# Patient Record
Sex: Male | Born: 1945 | Hispanic: Yes | Marital: Married | Smoking: Never smoker
Health system: Southern US, Community
[De-identification: ages and names within clinical notes are randomized; demographics above are authoritative.]

## PROBLEM LIST (undated history)

## (undated) DIAGNOSIS — K219 Gastro-esophageal reflux disease without esophagitis: Secondary | ICD-10-CM

## (undated) DIAGNOSIS — I10 Essential (primary) hypertension: Secondary | ICD-10-CM

## (undated) DIAGNOSIS — J849 Interstitial pulmonary disease, unspecified: Secondary | ICD-10-CM

## (undated) DIAGNOSIS — R42 Dizziness and giddiness: Secondary | ICD-10-CM

## (undated) DIAGNOSIS — G459 Transient cerebral ischemic attack, unspecified: Secondary | ICD-10-CM

---

## 2004-11-17 ENCOUNTER — Ambulatory Visit: Payer: Self-pay | Admitting: Family Medicine

## 2005-02-10 ENCOUNTER — Emergency Department: Payer: Self-pay | Admitting: General Practice

## 2013-10-18 ENCOUNTER — Emergency Department: Payer: Self-pay | Admitting: Emergency Medicine

## 2013-10-18 LAB — CBC WITH DIFFERENTIAL/PLATELET
BASOS ABS: 0.1 10*3/uL (ref 0.0–0.1)
BASOS PCT: 0.7 %
EOS ABS: 0.2 10*3/uL (ref 0.0–0.7)
EOS PCT: 2.1 %
HCT: 47.5 % (ref 40.0–52.0)
HGB: 15.4 g/dL (ref 13.0–18.0)
Lymphocyte #: 3.4 10*3/uL (ref 1.0–3.6)
Lymphocyte %: 33.9 %
MCH: 30.8 pg (ref 26.0–34.0)
MCHC: 32.4 g/dL (ref 32.0–36.0)
MCV: 95 fL (ref 80–100)
MONO ABS: 1 x10 3/mm (ref 0.2–1.0)
Monocyte %: 9.5 %
NEUTROS PCT: 53.8 %
Neutrophil #: 5.4 10*3/uL (ref 1.4–6.5)
Platelet: 236 10*3/uL (ref 150–440)
RBC: 5 10*6/uL (ref 4.40–5.90)
RDW: 13.6 % (ref 11.5–14.5)
WBC: 10 10*3/uL (ref 3.8–10.6)

## 2013-10-18 LAB — COMPREHENSIVE METABOLIC PANEL
Albumin: 3.8 g/dL (ref 3.4–5.0)
Alkaline Phosphatase: 92 U/L
Anion Gap: 4 — ABNORMAL LOW (ref 7–16)
BUN: 11 mg/dL (ref 7–18)
Bilirubin,Total: 0.4 mg/dL (ref 0.2–1.0)
CALCIUM: 9.8 mg/dL (ref 8.5–10.1)
Chloride: 102 mmol/L (ref 98–107)
Co2: 30 mmol/L (ref 21–32)
Creatinine: 0.87 mg/dL (ref 0.60–1.30)
EGFR (Non-African Amer.): >60
Glucose: 146 mg/dL — ABNORMAL HIGH (ref 65–99)
Osmolality: 274 (ref 275–301)
Potassium: 4 mmol/L (ref 3.5–5.1)
SGOT(AST): 17 U/L (ref 15–37)
SGPT (ALT): 33 U/L (ref 12–78)
Sodium: 136 mmol/L (ref 136–145)
Total Protein: 8 g/dL (ref 6.4–8.2)

## 2013-10-18 LAB — URINALYSIS, COMPLETE
BACTERIA: NONE SEEN
BILIRUBIN, UR: NEGATIVE
BLOOD: NEGATIVE
Glucose,UR: NEGATIVE mg/dL (ref 0–75)
KETONE: NEGATIVE
Leukocyte Esterase: NEGATIVE
Nitrite: NEGATIVE
Ph: 7 (ref 4.5–8.0)
Protein: NEGATIVE
RBC,UR: NONE SEEN /HPF (ref 0–5)
SQUAMOUS EPITHELIAL: NONE SEEN
Specific Gravity: 1.005 (ref 1.003–1.030)
WBC UR: NONE SEEN /HPF (ref 0–5)

## 2013-10-18 LAB — TROPONIN I: Troponin-I: <0.02 ng/mL

## 2013-10-18 LAB — LIPASE, BLOOD: Lipase: 179 U/L (ref 73–393)

## 2013-11-16 ENCOUNTER — Emergency Department: Payer: Self-pay | Admitting: Emergency Medicine

## 2013-11-16 LAB — COMPREHENSIVE METABOLIC PANEL
ALK PHOS: 98 U/L
Albumin: 3.6 g/dL (ref 3.4–5.0)
Anion Gap: 7 (ref 7–16)
BILIRUBIN TOTAL: 0.3 mg/dL (ref 0.2–1.0)
BUN: 9 mg/dL (ref 7–18)
CHLORIDE: 107 mmol/L (ref 98–107)
CO2: 25 mmol/L (ref 21–32)
Calcium, Total: 8.8 mg/dL (ref 8.5–10.1)
Creatinine: 0.83 mg/dL (ref 0.60–1.30)
GLUCOSE: 110 mg/dL — AB (ref 65–99)
OSMOLALITY: 277 (ref 275–301)
Potassium: 3.6 mmol/L (ref 3.5–5.1)
SGOT(AST): 26 U/L (ref 15–37)
SGPT (ALT): 38 U/L (ref 12–78)
Sodium: 139 mmol/L (ref 136–145)
Total Protein: 7.8 g/dL (ref 6.4–8.2)

## 2013-11-16 LAB — CBC WITH DIFFERENTIAL/PLATELET
BASOS ABS: 0.1 10*3/uL (ref 0.0–0.1)
BASOS PCT: 1 %
Eosinophil #: 0.2 10*3/uL (ref 0.0–0.7)
Eosinophil %: 2.2 %
HCT: 45.7 % (ref 40.0–52.0)
HGB: 14.9 g/dL (ref 13.0–18.0)
Lymphocyte #: 2 10*3/uL (ref 1.0–3.6)
Lymphocyte %: 25.6 %
MCH: 30.7 pg (ref 26.0–34.0)
MCHC: 32.6 g/dL (ref 32.0–36.0)
MCV: 94 fL (ref 80–100)
Monocyte #: 0.6 x10 3/mm (ref 0.2–1.0)
Monocyte %: 8.1 %
NEUTROS ABS: 5 10*3/uL (ref 1.4–6.5)
Neutrophil %: 63.1 %
PLATELETS: 218 10*3/uL (ref 150–440)
RBC: 4.84 10*6/uL (ref 4.40–5.90)
RDW: 13.6 % (ref 11.5–14.5)
WBC: 7.9 10*3/uL (ref 3.8–10.6)

## 2013-11-16 LAB — URINALYSIS, COMPLETE
BACTERIA: NONE SEEN
BILIRUBIN, UR: NEGATIVE
BLOOD: NEGATIVE
Glucose,UR: NEGATIVE mg/dL (ref 0–75)
Ketone: NEGATIVE
Leukocyte Esterase: NEGATIVE
Nitrite: NEGATIVE
Ph: 7 (ref 4.5–8.0)
Protein: NEGATIVE
RBC, UR: NONE SEEN /HPF (ref 0–5)
Specific Gravity: 1.006 (ref 1.003–1.030)

## 2013-11-16 LAB — TROPONIN I

## 2013-11-16 LAB — LIPASE, BLOOD: Lipase: 151 U/L (ref 73–393)

## 2013-11-21 ENCOUNTER — Emergency Department: Payer: Self-pay | Admitting: Emergency Medicine

## 2013-12-05 ENCOUNTER — Ambulatory Visit: Payer: Self-pay | Admitting: Gastroenterology

## 2014-07-10 ENCOUNTER — Emergency Department: Payer: Self-pay | Admitting: Emergency Medicine

## 2014-08-12 ENCOUNTER — Ambulatory Visit: Payer: Self-pay | Admitting: Otolaryngology

## 2014-08-28 ENCOUNTER — Ambulatory Visit: Payer: Self-pay | Admitting: Otolaryngology

## 2014-12-09 LAB — SURGICAL PATHOLOGY

## 2014-12-15 NOTE — Op Note (Signed)
PATIENT NAME:  Dean Maldonado, Dean Maldonado MR#:  [id number] DATE OF BIRTH:  [birth year]  DATE OF PROCEDURE:  08/28/2014  PREOPERATIVE DIAGNOSIS: Dysphonia with right vocal cord neoplasm.   POSTOPERATIVE DIAGNOSIS: Dysphonia with right vocal cord neoplasm.   PROCEDURE: Microlaryngoscopy with microsurgical excision of right true vocal cord neoplasm.   SURGEON: Ollen Grossaul S. Willeen CassBennett, MD   ANESTHESIA: General endotracheal.   INDICATIONS: A 69 year old male with progressive hoarseness with a nodular lesion involving the right anterior true vocal cord.   FINDINGS: There was a multilobulated nodular lesion of the anterior third of the right true vocal cord. There was either inflammatory involvement or potentially invasion of the vocalis muscle noted during dissection. The lesion was sent in formalin for pathology. No other abnormalities were seen in the endolarynx or piriform sinus or tongue base.   COMPLICATIONS: None.   DESCRIPTION OF PROCEDURE: After obtaining informed consent, the patient was taken to the Operating Room and placed in the supine position. After induction of general endotracheal anesthesia, the patient was turned 90 degrees and the head draped with the eyes protected. A tooth guard was used to protect the upper dentition. A Dedo laryngoscope was then used to evaluate the airway. The hypopharynx, larynx and tongue base were carefully inspected. The scope was placed into the endolarynx and the lesion visualized. The patient was placed into suspension with the Lewy arm. Photodocumentation of the lesion was obtained with a telescope Hopkins rod. Next, the binocular microscope was placed into position and the area was carefully inspected manipulating the lesion with suctioned. An Afrin moistened pledget was used to provide some vasoconstriction to reduce bleeding. The lesion was then grasped with cup forceps and carefully resected from the underlying vocalis muscle utilizing microsurgical dissection with  microsurgical scissors. The lesion was excised in its entirety, but appeared to be adherent to the underlying vocalis muscle. Minor bleeding was controlled with Afrin moistened pledgets. The Dedo scope was then removed and the patient returned to the anesthesiologist for awakening. He was awakened and taken to the recovery room in good condition postoperatively.    ____________________________ Ollen GrossPaul S. Willeen CassBennett, MD psb:at D: 08/28/2014 08:32:22 ET T: 08/28/2014 14:15:20 ET JOB#: [id number]  cc: Ollen GrossPaul S. Willeen CassBennett, MD, <Dictator> Sandi MealyPAUL S Chesnee Floren MD ELECTRONICALLY SIGNED 09/03/2014 8:40

## 2015-06-02 ENCOUNTER — Other Ambulatory Visit: Payer: Self-pay | Admitting: Primary Care

## 2015-06-02 DIAGNOSIS — R1084 Generalized abdominal pain: Secondary | ICD-10-CM

## 2015-06-04 ENCOUNTER — Ambulatory Visit
Admission: RE | Admit: 2015-06-04 | Discharge: 2015-06-04 | Disposition: A | Discharge status: Home / Self Care | Payer: Medicare Other | Source: Ambulatory Visit | Attending: Primary Care | Admitting: Primary Care

## 2015-06-04 DIAGNOSIS — R1084 Generalized abdominal pain: Secondary | ICD-10-CM | POA: Diagnosis present

## 2017-01-24 ENCOUNTER — Other Ambulatory Visit: Payer: Self-pay | Admitting: Internal Medicine

## 2017-01-24 ENCOUNTER — Ambulatory Visit
Admission: RE | Admit: 2017-01-24 | Discharge: 2017-01-24 | Disposition: A | Discharge status: Home / Self Care | Payer: Medicare Other | Source: Ambulatory Visit | Attending: Internal Medicine | Admitting: Internal Medicine

## 2017-01-24 DIAGNOSIS — I517 Cardiomegaly: Secondary | ICD-10-CM | POA: Diagnosis not present

## 2017-01-24 DIAGNOSIS — J690 Pneumonitis due to inhalation of food and vomit: Secondary | ICD-10-CM

## 2017-01-24 DIAGNOSIS — J189 Pneumonia, unspecified organism: Secondary | ICD-10-CM | POA: Insufficient documentation

## 2017-01-24 DIAGNOSIS — I700 Atherosclerosis of aorta: Secondary | ICD-10-CM | POA: Insufficient documentation

## 2018-07-25 ENCOUNTER — Ambulatory Visit
Admission: RE | Admit: 2018-07-25 | Discharge: 2018-07-25 | Disposition: A | Discharge status: Home / Self Care | Payer: Medicare Other | Source: Ambulatory Visit | Attending: Primary Care | Admitting: Primary Care

## 2018-07-25 ENCOUNTER — Other Ambulatory Visit: Payer: Self-pay | Admitting: Primary Care

## 2018-07-25 DIAGNOSIS — R0781 Pleurodynia: Secondary | ICD-10-CM | POA: Diagnosis present

## 2020-03-04 ENCOUNTER — Observation Stay
Admission: EM | Admit: 2020-03-04 | Discharge: 2020-03-05 | Disposition: A | Discharge status: Home / Self Care | Payer: Medicare Other | Attending: Internal Medicine | Admitting: Internal Medicine

## 2020-03-04 ENCOUNTER — Observation Stay: Payer: Medicare Other

## 2020-03-04 ENCOUNTER — Observation Stay
Admit: 2020-03-04 | Discharge: 2020-03-04 | Disposition: A | Discharge status: Home / Self Care | Payer: Medicare Other | Attending: Family Medicine | Admitting: Family Medicine

## 2020-03-04 ENCOUNTER — Encounter: Payer: Self-pay | Admitting: Emergency Medicine

## 2020-03-04 ENCOUNTER — Emergency Department: Payer: Medicare Other

## 2020-03-04 ENCOUNTER — Other Ambulatory Visit: Payer: Self-pay

## 2020-03-04 DIAGNOSIS — Z79899 Other long term (current) drug therapy: Secondary | ICD-10-CM | POA: Insufficient documentation

## 2020-03-04 DIAGNOSIS — R42 Dizziness and giddiness: Principal | ICD-10-CM

## 2020-03-04 DIAGNOSIS — I10 Essential (primary) hypertension: Secondary | ICD-10-CM | POA: Diagnosis not present

## 2020-03-04 DIAGNOSIS — E119 Type 2 diabetes mellitus without complications: Secondary | ICD-10-CM

## 2020-03-04 DIAGNOSIS — Z20822 Contact with and (suspected) exposure to covid-19: Secondary | ICD-10-CM | POA: Diagnosis not present

## 2020-03-04 DIAGNOSIS — H538 Other visual disturbances: Secondary | ICD-10-CM | POA: Diagnosis not present

## 2020-03-04 DIAGNOSIS — H5500 Unspecified nystagmus: Secondary | ICD-10-CM

## 2020-03-04 DIAGNOSIS — G459 Transient cerebral ischemic attack, unspecified: Secondary | ICD-10-CM

## 2020-03-04 DIAGNOSIS — H3589 Other specified retinal disorders: Secondary | ICD-10-CM | POA: Insufficient documentation

## 2020-03-04 HISTORY — DX: Essential (primary) hypertension: I10

## 2020-03-04 LAB — ETHANOL: Alcohol, Ethyl (B): <10 mg/dL (ref <10)

## 2020-03-04 LAB — URINALYSIS, COMPLETE (UACMP) WITH MICROSCOPIC
Bacteria, UA: NONE SEEN
Bilirubin Urine: NEGATIVE
Glucose, UA: >=500 mg/dL — AB
Hgb urine dipstick: NEGATIVE
Ketones, ur: 5 mg/dL — AB
Leukocytes,Ua: NEGATIVE
Nitrite: NEGATIVE
Protein, ur: NEGATIVE mg/dL
Specific Gravity, Urine: 1.032 — ABNORMAL HIGH (ref 1.005–1.030)
Squamous Epithelial / HPF: NONE SEEN (ref 0–5)
pH: 6 (ref 5.0–8.0)

## 2020-03-04 LAB — LIPID PANEL
Cholesterol: 140 mg/dL (ref 0–200)
HDL: 50 mg/dL (ref >40)
LDL Cholesterol: 71 mg/dL (ref 0–99)
Total CHOL/HDL Ratio: 2.8 RATIO
Triglycerides: 95 mg/dL (ref <150)
VLDL: 19 mg/dL (ref 0–40)

## 2020-03-04 LAB — ECHOCARDIOGRAM COMPLETE
AR max vel: 4.44 cm2
AV Area VTI: 4.49 cm2
AV Area mean vel: 4.34 cm2
AV Mean grad: 2 mmHg
AV Peak grad: 4.6 mmHg
Ao pk vel: 1.07 m/s
Area-P 1/2: 6.96 cm2
S' Lateral: 3.33 cm
Weight: 3555.58 oz

## 2020-03-04 LAB — CBC WITH DIFFERENTIAL/PLATELET
Abs Immature Granulocytes: 0.06 10*3/uL (ref 0.00–0.07)
Basophils Absolute: 0.1 10*3/uL (ref 0.0–0.1)
Basophils Relative: 1 %
Eosinophils Absolute: 0.3 10*3/uL (ref 0.0–0.5)
Eosinophils Relative: 2 %
HCT: 44.2 % (ref 39.0–52.0)
Hemoglobin: 14.6 g/dL (ref 13.0–17.0)
Immature Granulocytes: 1 %
Lymphocytes Relative: 23 %
Lymphs Abs: 2.6 10*3/uL (ref 0.7–4.0)
MCH: 31.1 pg (ref 26.0–34.0)
MCHC: 33 g/dL (ref 30.0–36.0)
MCV: 94 fL (ref 80.0–100.0)
Monocytes Absolute: 0.8 10*3/uL (ref 0.1–1.0)
Monocytes Relative: 7 %
Neutro Abs: 7.6 10*3/uL (ref 1.7–7.7)
Neutrophils Relative %: 66 %
Platelets: 213 10*3/uL (ref 150–400)
RBC: 4.7 MIL/uL (ref 4.22–5.81)
RDW: 12.5 % (ref 11.5–15.5)
WBC: 11.3 10*3/uL — ABNORMAL HIGH (ref 4.0–10.5)
nRBC: 0 % (ref 0.0–0.2)

## 2020-03-04 LAB — COMPREHENSIVE METABOLIC PANEL
ALT: 38 U/L (ref 0–44)
AST: 26 U/L (ref 15–41)
Albumin: 4 g/dL (ref 3.5–5.0)
Alkaline Phosphatase: 150 U/L — ABNORMAL HIGH (ref 38–126)
Anion gap: 8 (ref 5–15)
BUN: 16 mg/dL (ref 8–23)
CO2: 28 mmol/L (ref 22–32)
Calcium: 9.3 mg/dL (ref 8.9–10.3)
Chloride: 99 mmol/L (ref 98–111)
Creatinine, Ser: 0.85 mg/dL (ref 0.61–1.24)
GFR calc Af Amer: >60 mL/min (ref >60)
GFR calc non Af Amer: >60 mL/min (ref >60)
Glucose, Bld: 529 mg/dL (ref 70–99)
Potassium: 4.4 mmol/L (ref 3.5–5.1)
Sodium: 135 mmol/L (ref 135–145)
Total Bilirubin: 0.7 mg/dL (ref 0.3–1.2)
Total Protein: 7.7 g/dL (ref 6.5–8.1)

## 2020-03-04 LAB — URINE DRUG SCREEN, QUALITATIVE (ARMC ONLY)
Amphetamines, Ur Screen: NOT DETECTED
Barbiturates, Ur Screen: NOT DETECTED
Benzodiazepine, Ur Scrn: NOT DETECTED
Cannabinoid 50 Ng, Ur ~~LOC~~: NOT DETECTED
Cocaine Metabolite,Ur ~~LOC~~: NOT DETECTED
MDMA (Ecstasy)Ur Screen: NOT DETECTED
Methadone Scn, Ur: NOT DETECTED
Opiate, Ur Screen: NOT DETECTED
Phencyclidine (PCP) Ur S: NOT DETECTED
Tricyclic, Ur Screen: NOT DETECTED

## 2020-03-04 LAB — GLUCOSE, CAPILLARY
Glucose-Capillary: 420 mg/dL — ABNORMAL HIGH (ref 70–99)
Glucose-Capillary: 598 mg/dL (ref 70–99)
Glucose-Capillary: 366 mg/dL — ABNORMAL HIGH (ref 70–99)
Glucose-Capillary: 311 mg/dL — ABNORMAL HIGH (ref 70–99)
Glucose-Capillary: 229 mg/dL — ABNORMAL HIGH (ref 70–99)

## 2020-03-04 LAB — HEMOGLOBIN A1C
Hgb A1c MFr Bld: 11.4 % — ABNORMAL HIGH (ref 4.8–5.6)
Mean Plasma Glucose: 280 mg/dL

## 2020-03-04 LAB — SARS CORONAVIRUS 2 BY RT PCR (HOSPITAL ORDER, PERFORMED IN ~~LOC~~ HOSPITAL LAB): SARS Coronavirus 2: NEGATIVE

## 2020-03-04 LAB — APTT: aPTT: <24 seconds — ABNORMAL LOW (ref 24–36)

## 2020-03-04 LAB — PROTIME-INR
INR: 0.9 (ref 0.8–1.2)
Prothrombin Time: 12.2 seconds (ref 11.4–15.2)

## 2020-03-04 LAB — TROPONIN I (HIGH SENSITIVITY): Troponin I (High Sensitivity): 6 ng/L (ref <18)

## 2020-03-04 MED ORDER — ASPIRIN 81 MG PO CHEW
324.0000 mg | CHEWABLE_TABLET | Freq: Once | ORAL | Status: AC
  Administered 2020-03-04: 324 mg via ORAL
  Filled 2020-03-04: qty 4

## 2020-03-04 MED ORDER — LORAZEPAM 2 MG/ML IJ SOLN: 1.0000 mg | Freq: Once | INTRAMUSCULAR | Status: DC

## 2020-03-04 MED ORDER — LORAZEPAM 2 MG/ML IJ SOLN
INTRAMUSCULAR | Status: AC
  Administered 2020-03-04: 1 mg via INTRAVENOUS
  Filled 2020-03-04: qty 1

## 2020-03-04 MED ORDER — ONDANSETRON HCL 4 MG PO TABS: 4.0000 mg | ORAL_TABLET | Freq: Four times a day (QID) | ORAL | Status: DC | PRN

## 2020-03-04 MED ORDER — ONDANSETRON HCL 4 MG/2ML IJ SOLN: 4.0000 mg | Freq: Four times a day (QID) | INTRAMUSCULAR | Status: DC | PRN

## 2020-03-04 MED ORDER — INSULIN ASPART 100 UNIT/ML ~~LOC~~ SOLN
0.0000 [IU] | Freq: Three times a day (TID) | SUBCUTANEOUS | Status: DC
  Administered 2020-03-04: 21:00:00 3 [IU] via SUBCUTANEOUS
  Administered 2020-03-04 (×2): 9 [IU] via SUBCUTANEOUS
  Administered 2020-03-04: 19:00:00 7 [IU] via SUBCUTANEOUS
  Administered 2020-03-05 (×2): 3 [IU] via SUBCUTANEOUS
  Filled 2020-03-04 (×6): qty 1

## 2020-03-04 MED ORDER — INSULIN STARTER KIT- PEN NEEDLES (ENGLISH)
1.0000 | Freq: Once | Status: AC
  Administered 2020-03-05: 1
  Filled 2020-03-04: qty 1

## 2020-03-04 MED ORDER — LORAZEPAM 2 MG/ML IJ SOLN: 1.0000 mg | Freq: Once | INTRAMUSCULAR | Status: AC

## 2020-03-04 MED ORDER — ACETAMINOPHEN 650 MG RE SUPP: 650.0000 mg | Freq: Four times a day (QID) | RECTAL | Status: DC | PRN

## 2020-03-04 MED ORDER — ONDANSETRON HCL 4 MG/2ML IJ SOLN: 4.0000 mg | Freq: Once | INTRAMUSCULAR | Status: AC

## 2020-03-04 MED ORDER — TRAZODONE HCL 50 MG PO TABS: 25.0000 mg | ORAL_TABLET | Freq: Every evening | ORAL | Status: DC | PRN

## 2020-03-04 MED ORDER — MAGNESIUM HYDROXIDE 400 MG/5ML PO SUSP
30.0000 mL | Freq: Every day | ORAL | Status: DC | PRN
  Filled 2020-03-04: qty 30

## 2020-03-04 MED ORDER — ENOXAPARIN SODIUM 40 MG/0.4ML ~~LOC~~ SOLN
40.0000 mg | SUBCUTANEOUS | Status: DC
  Administered 2020-03-04 – 2020-03-05 (×2): 40 mg via SUBCUTANEOUS
  Filled 2020-03-04 (×2): qty 0.4

## 2020-03-04 MED ORDER — IOHEXOL 350 MG/ML SOLN
75.0000 mL | Freq: Once | INTRAVENOUS | Status: AC | PRN
  Administered 2020-03-04: 75 mL via INTRAVENOUS

## 2020-03-04 MED ORDER — SODIUM CHLORIDE 0.9 % IV SOLN: INTRAVENOUS | Status: DC

## 2020-03-04 MED ORDER — LIVING WELL WITH DIABETES BOOK
Freq: Once | Status: AC
  Filled 2020-03-04 (×2): qty 1

## 2020-03-04 MED ORDER — ONDANSETRON HCL 4 MG/2ML IJ SOLN
INTRAMUSCULAR | Status: AC
  Administered 2020-03-04: 4 mg via INTRAVENOUS
  Filled 2020-03-04: qty 2

## 2020-03-04 MED ORDER — LACTATED RINGERS IV BOLUS
1000.0000 mL | Freq: Once | INTRAVENOUS | Status: AC
  Administered 2020-03-04: 1000 mL via INTRAVENOUS

## 2020-03-04 MED ORDER — ACETAMINOPHEN 325 MG PO TABS: 650.0000 mg | ORAL_TABLET | Freq: Four times a day (QID) | ORAL | Status: DC | PRN

## 2020-03-04 MED ORDER — STROKE: EARLY STAGES OF RECOVERY BOOK: Freq: Once | Status: DC

## 2020-03-04 MED ORDER — INSULIN ASPART PROT & ASPART (70-30 MIX) 100 UNIT/ML ~~LOC~~ SUSP
28.0000 [IU] | Freq: Two times a day (BID) | SUBCUTANEOUS | Status: DC
  Administered 2020-03-04 – 2020-03-05 (×3): 28 [IU] via SUBCUTANEOUS
  Filled 2020-03-04 (×3): qty 10

## 2020-03-04 NOTE — ED Provider Notes (Signed)
[facility] Emergency Department Provider Note  ____________________________________________  Time seen: Approximately 3:21 AM  I have reviewed the triage vital signs and the nursing notes.   HISTORY  Chief Complaint Dizziness   HPI Dean Maldonado is a 74 y.o. male with a history of hypertension who presents via EMS for vertigo.  Patient reports sudden onset of vertigo 30 minutes prior to arrival.  He reports that he was laying in bed when it started.  He has vomited several times.  He denies any prior history of vertigo.  Has not seen a physician in several years.  He denies any personal or family history of stroke.  No headache, no slurred speech, no facial droop, no unilateral weakness or numbness.  No fever or chills.  No chest pain or shortness of breath.  His symptoms have been constant and severe.  They are better with his eyes closed but worse with eyes open.  He is complaining of mild blurry vision.   Past Medical History:  Diagnosis Date   Hypertension   Allergies Patient has no known allergies.  No family history on file.  Social History Social History   Tobacco Use   Smoking status: Never Smoker   Smokeless tobacco: Never Used  Substance Use Topics   Alcohol use: Not on file   Drug use: Not on file    Review of Systems  Constitutional: Negative for fever. Eyes: Negative for visual changes. ENT: Negative for sore throat. Neck: No neck pain  Cardiovascular: Negative for chest pain. Respiratory: Negative for shortness of breath. Gastrointestinal: Negative for abdominal pain or diarrhea. + Nausea and vomiting Genitourinary: Negative for dysuria. Musculoskeletal: Negative for back pain. Skin: Negative for rash. Neurological: Negative for headaches, weakness or numbness. + Vertigo Psych: No SI or HI  ____________________________________________   PHYSICAL EXAM:  VITAL SIGNS: ED Triage Vitals  Enc Vitals Group     BP  03/04/20 0130 (!) 145/82     Pulse Rate 03/04/20 0130 82     Resp 03/04/20 0130 18     Temp 03/04/20 0130 (!) 97.4 F (36.3 C)     Temp Source 03/04/20 0130 Oral     SpO2 03/04/20 0130 97 %     Weight 03/04/20 0145 222 lb 3.6 oz (100.8 kg)     Height --      Head Circumference --      Peak Flow --      Pain Score 03/04/20 0145 0     Pain Loc --      Pain Edu? --      Excl. in GC? --     Constitutional: Alert and oriented, looks unwell but in no obvious distress.  HEENT:      Head: Normocephalic and atraumatic.         Eyes: Conjunctivae are normal. Sclera is non-icteric. PERRL, EOMI      Mouth/Throat: Mucous membranes are moist.       Neck: Supple with no signs of meningismus. Cardiovascular: Regular rate and rhythm. No murmurs, gallops, or rubs. Respiratory: Normal respiratory effort. Lungs are clear to auscultation bilaterally.  Gastrointestinal: Soft, non tender. Musculoskeletal:  No edema, cyanosis, or erythema of extremities. Neurologic: Normal speech and language. Face is symmetric.  Patient has rotational nystagmus at left and gaze.  Intact strength and sensation x4, no dysmetria or pronator drift.  Gait is unable to be evaluated at this time due to severe vertigo. Skin: Skin is warm, dry and  intact. No rash noted. Psychiatric: Mood and affect are normal. Speech and behavior are normal.  ____________________________________________   LABS (all labs ordered are listed, but only abnormal results are displayed)  Labs Reviewed  CBC WITH DIFFERENTIAL/PLATELET - Abnormal; Notable for the following components:      Result Value   WBC 11.3 (*)    All other components within normal limits  URINALYSIS, COMPLETE (UACMP) WITH MICROSCOPIC - Abnormal; Notable for the following components:   Color, Urine STRAW (*)    APPearance CLEAR (*)    Specific Gravity, Urine 1.032 (*)    Glucose, UA >=500 (*)    Ketones, ur 5 (*)    All other components within normal limits   COMPREHENSIVE METABOLIC PANEL - Abnormal; Notable for the following components:   Glucose, Bld 529 (*)    Alkaline Phosphatase 150 (*)    All other components within normal limits  SARS CORONAVIRUS 2 BY RT PCR (HOSPITAL ORDER, PERFORMED IN [facility])  ETHANOL  URINE DRUG SCREEN, QUALITATIVE ([facility] ONLY)  PROTIME-INR  PROTIME-INR  APTT  TROPONIN I (HIGH SENSITIVITY)   ____________________________________________  EKG  ED ECG REPORT I, Nita Sickle, the attending physician, personally viewed and interpreted this ECG.  Normal sinus rhythm, rate of 87, normal intervals, left axis deviation, no ST elevations or depressions. ____________________________________________  RADIOLOGY  I have personally reviewed the images performed during this visit and I agree with the Radiologist's read.   Interpretation by Radiologist:  CT ANGIO HEAD W OR WO CONTRAST  Result Date: 03/04/2020 CLINICAL DATA:  Vertigo EXAM: CT ANGIOGRAPHY HEAD AND NECK TECHNIQUE: Multidetector CT imaging of the head and neck was performed using the standard protocol during bolus administration of intravenous contrast. Multiplanar CT image reconstructions and MIPs were obtained to evaluate the vascular anatomy. Carotid stenosis measurements (when applicable) are obtained utilizing NASCET criteria, using the distal internal carotid diameter as the denominator. CONTRAST:  92mL OMNIPAQUE IOHEXOL 350 MG/ML SOLN COMPARISON:  None. FINDINGS: CTA NECK FINDINGS SKELETON: There is no bony spinal canal stenosis. No lytic or blastic lesion. OTHER NECK: Normal pharynx, larynx and major salivary glands. No cervical lymphadenopathy. Unremarkable thyroid gland. UPPER CHEST: Mild biapical interstitial opacity. AORTIC ARCH: There is mild calcific atherosclerosis of the aortic arch. There is no aneurysm, dissection or hemodynamically significant stenosis of the visualized portion of the aorta. Conventional 3 vessel aortic  branching pattern. The visualized proximal subclavian arteries are widely patent. RIGHT CAROTID SYSTEM: Normal without aneurysm, dissection or stenosis. LEFT CAROTID SYSTEM: Normal without aneurysm, dissection or stenosis. VERTEBRAL ARTERIES: Left dominant configuration. Both origins are clearly patent. There is no dissection, occlusion or flow-limiting stenosis to the skull base (V1-V3 segments). CTA HEAD FINDINGS POSTERIOR CIRCULATION: --Vertebral arteries: Normal V4 segments. --Inferior cerebellar arteries: Normal. --Basilar artery: Normal. --Superior cerebellar arteries: Normal. --Posterior cerebral arteries (PCA): Normal. ANTERIOR CIRCULATION: --Intracranial internal carotid arteries: Normal. --Anterior cerebral arteries (ACA): Normal. Both A1 segments are present. Patent anterior communicating artery (a-comm). --Middle cerebral arteries (MCA): Normal. VENOUS SINUSES: As permitted by contrast timing, patent. ANATOMIC VARIANTS: None Review of the MIP images confirms the above findings. IMPRESSION: 1. No emergent large vessel occlusion or high-grade stenosis of the intracranial arteries. 2. Aortic Atherosclerosis (ICD10-I70.0). Electronically Signed   By: Deatra Robinson M.D.   On: 03/04/2020 03:50   CT ANGIO NECK W OR WO CONTRAST  Result Date: 03/04/2020 CLINICAL DATA:  Vertigo EXAM: CT ANGIOGRAPHY HEAD AND NECK TECHNIQUE: Multidetector CT imaging of the head and neck  was performed using the standard protocol during bolus administration of intravenous contrast. Multiplanar CT image reconstructions and MIPs were obtained to evaluate the vascular anatomy. Carotid stenosis measurements (when applicable) are obtained utilizing NASCET criteria, using the distal internal carotid diameter as the denominator. CONTRAST:  75mL OMNIPAQUE IOHEXOL 350 MG/ML SOLN COMPARISON:  None. FINDINGS: CTA NECK FINDINGS SKELETON: There is no bony spinal canal stenosis. No lytic or blastic lesion. OTHER NECK: Normal pharynx, larynx and  major salivary glands. No cervical lymphadenopathy. Unremarkable thyroid gland. UPPER CHEST: Mild biapical interstitial opacity. AORTIC ARCH: There is mild calcific atherosclerosis of the aortic arch. There is no aneurysm, dissection or hemodynamically significant stenosis of the visualized portion of the aorta. Conventional 3 vessel aortic branching pattern. The visualized proximal subclavian arteries are widely patent. RIGHT CAROTID SYSTEM: Normal without aneurysm, dissection or stenosis. LEFT CAROTID SYSTEM: Normal without aneurysm, dissection or stenosis. VERTEBRAL ARTERIES: Left dominant configuration. Both origins are clearly patent. There is no dissection, occlusion or flow-limiting stenosis to the skull base (V1-V3 segments). CTA HEAD FINDINGS POSTERIOR CIRCULATION: --Vertebral arteries: Normal V4 segments. --Inferior cerebellar arteries: Normal. --Basilar artery: Normal. --Superior cerebellar arteries: Normal. --Posterior cerebral arteries (PCA): Normal. ANTERIOR CIRCULATION: --Intracranial internal carotid arteries: Normal. --Anterior cerebral arteries (ACA): Normal. Both A1 segments are present. Patent anterior communicating artery (a-comm). --Middle cerebral arteries (MCA): Normal. VENOUS SINUSES: As permitted by contrast timing, patent. ANATOMIC VARIANTS: None Review of the MIP images confirms the above findings. IMPRESSION: 1. No emergent large vessel occlusion or high-grade stenosis of the intracranial arteries. 2. Aortic Atherosclerosis (ICD10-I70.0). Electronically Signed   By: Deatra RobinsonKevin  Herman M.D.   On: 03/04/2020 03:50   CT HEAD CODE STROKE WO CONTRAST`  Result Date: 03/04/2020 CLINICAL DATA:  Code stroke.  Central vertigo EXAM: CT HEAD WITHOUT CONTRAST TECHNIQUE: Contiguous axial images were obtained from the base of the skull through the vertex without intravenous contrast. COMPARISON:  None. FINDINGS: Brain: There is no mass, hemorrhage or extra-axial collection. The size and configuration of  the ventricles and extra-axial CSF spaces are normal. The brain parenchyma is normal, without evidence of acute or chronic infarction. Vascular: No abnormal hyperdensity of the major intracranial arteries or dural venous sinuses. No intracranial atherosclerosis. Skull: The visualized skull base, calvarium and extracranial soft tissues are normal. Sinuses/Orbits: No fluid levels or advanced mucosal thickening of the visualized paranasal sinuses. No mastoid or middle ear effusion. The orbits are normal. ASPECTS [facility]([place] Stroke Program Early CT Score) - Ganglionic level infarction (caudate, lentiform nuclei, internal capsule, insula, M1-M3 cortex): 7 - Supraganglionic infarction (M4-M6 cortex): 3 Total score (0-10 with 10 being normal): 10 IMPRESSION: 1. Normal head CT. 2. ASPECTS is 10. These results were called by telephone at the time of interpretation on 03/04/2020 at 2:25 am to provider [facility] Keatin Ben[facility] , who verbally acknowledged these results. Electronically Signed   By: Deatra RobinsonKevin  Herman M.D.   On: 03/04/2020 02:27     ____________________________________________   PROCEDURES  Procedure(s) performed:yes .1-3 Lead EKG Interpretation Performed by: Nita SickleVeronese, [place], MD Authorized by: Nita SickleVeronese, [place], MD     Interpretation: normal     ECG rate assessment: normal     Rhythm: sinus rhythm     Ectopy: none     Critical Care performed:  None ____________________________________________   INITIAL IMPRESSION / ASSESSMENT AND PLAN / ED COURSE  74 y.o. male with a history of hypertension who presents via EMS for vertigo.  Patient arrives with sudden onset of vertigo starting 30 minutes prior to arrival.  Neurologically he has rotational nystagmus in left and gaze but no other neuro deficits.  Unable to ambulate due to severe vertigo.   Ddx Posterior fossa stroke, head bleed, subarachnoid hemorrhage although patient denies headache.  No meningeal signs, no fever.   Patient was made a code  stroke.  Head CT negative for bleed or large ischemic stroke, confirmed by radiology.  Patient was seen by neurology who recommended against TPA.  She did recommend CTA  of the head and neck to evaluate for large vessel occlusion, followed by admission to the hospitalist service for stroke evaluation.  Patient received Zofran and 1 mg of IV Ativan to help with his symptoms.  He reports that the dizziness has improved but he still having vertigo.  Labs are pending.  EKG showing no dysrhythmias or ischemia.  History gathered from patient and his wife, care discussed with both of them.  Old medical records reviewed.  _________________________ 3:55 AM on 03/04/2020 ----------------------------------------- CT angio of the head and neck with no large vessel occlusion.  Labs showing hyperglycemia with a glucose of 529 but no signs of DKA with normal anion gap.  MRI has been ordered.  Will consult hospitalist for admission for further evaluation of possible posterior fossa CVA.  Will give IV fluids for hyperglycemia.     _____________________________________________ Please note:  Patient was evaluated in Emergency Department today for the symptoms described in the history of present illness. Patient was evaluated in the context of the global COVID-19 pandemic, which necessitated consideration that the patient might be at risk for infection with the SARS-CoV-2 virus that causes COVID-19. Institutional protocols and algorithms that pertain to the evaluation of patients at risk for COVID-19 are in a state of rapid change based on information released by regulatory bodies including the CDC and federal and state organizations. These policies and algorithms were followed during the patient's care in the ED.  Some ED evaluations and interventions may be delayed as a result of limited staffing during the pandemic.   [place] Controlled Substance Database was reviewed by  me. ____________________________________________   FINAL CLINICAL IMPRESSION(S) / ED DIAGNOSES   Final diagnoses:  New onset type 2 diabetes mellitus (HCC)  Vertigo      NEW MEDICATIONS STARTED DURING THIS VISIT:  ED Discharge Orders    None       Note:  This document was prepared using Dragon voice recognition software and may include unintentional dictation errors.    Don Perking, [place], MD 03/04/20 [phone]

## 2020-03-04 NOTE — Evaluation (Signed)
Occupational Therapy Evaluation Patient Details Name: Dean Maldonado MRN: [id number] DOB: 1946-03-27 Today's Date: 03/04/2020    History of Present Illness Dean Maldonado  is a 74 y.o. male with a known history of hypertension, presented to the emergency room with acute onset of vertigo with associated vomiting that started before he came to the ER.  He has been feeling weak all over and denies any paresthesias.    Clinical Impression   [name] was seen for OT evaluation this date. Prior to hospital admission, pt was Independent in all aspects of ADL/IADL, including working full time, and denies falls history in past 12 months. Pt lives with his spouse in a 1 story mobile home with 9 STE. Currently pt reporting symptoms have resolved, ambulates in hallway c no AD and +1 for lines/lead mgmt. Pt desat during mobility to 90%, reports cramping in side, resolved c standing rest break to 95% on RA. Pt demonstrates baseline independence to perform ADL and mobility tasks and no strength, sensory, coordination, cognitive, or functional visual deficits appreciated with assessment. No skilled OT needs identified. Will sign off. Please re-consult if additional OT needs arise.     Follow Up Recommendations  No OT follow up    Equipment Recommendations  None recommended by OT    Recommendations for Other Services       Precautions / Restrictions Precautions Precautions: None Restrictions Weight Bearing Restrictions: No      Mobility Bed Mobility Overal bed mobility: Independent                Transfers Overall transfer level: Needs assistance Equipment used: None Transfers: Sit to/from Stand Sit to Stand: Supervision         General transfer comment: Supervision - assist for lines/leads mgmt - no physical assist sit<>stand at elevated stretcher height    Balance Overall balance assessment: Modified Independent                                          ADL either performed or assessed with clinical judgement   ADL Overall ADL's : Independent                                       General ADL Comments: Performs LBD/UBD, and ADL t/fs Independent c no AD      Vision Baseline Vision/History: No visual deficits Patient Visual Report: No change from baseline Additional Comments: Pt reports blurriness resolved. Difficulty following H test however pt was distracted by cramp pain and had no functional deficits appreciated this assessment     Perception     Praxis      Pertinent Vitals/Pain Pain Assessment: Faces Faces Pain Scale: Hurts little more Pain Location: L chest cramp  Pain Descriptors / Indicators: Cramping Pain Intervention(s): Limited activity within patient's tolerance;Repositioned     Hand Dominance     Extremity/Trunk Assessment Upper Extremity Assessment Upper Extremity Assessment: Overall WFL for tasks assessed (5/5 grossly )   Lower Extremity Assessment Lower Extremity Assessment: Overall WFL for tasks assessed       Communication Communication Communication: No difficulties   Cognition Arousal/Alertness: Awake/alert Behavior During Therapy: WFL for tasks assessed/performed Overall Cognitive Status: Within Functional Limits for tasks assessed  General Comments  Seated EOB: SpO2 98% on RA. Ambulating in hallway: desat 90%, pt reports cramping in side, resolved c rest break to 95% on RA    Exercises Exercises: Other exercises Other Exercises Other Exercises: Pt educated re: OT role, d/c recs, falls prevention, home/routine modifications, pain mgmt Other Exercises: LBD, UBD, sup<>sit, sit<>stand, sitting/standing balance/tolerance, functional reach, ~200 ft mobility,    Shoulder Instructions      Home Living Family/patient expects to be discharged to:: Private residence Living Arrangements: Spouse/significant other;Children (48 yo  daughter) Available Help at Discharge: Family;Available 24 hours/day Type of Home: Mobile home Home Access: Stairs to enter Entrance Stairs-Number of Steps: 9   Home Layout: One level     Bathroom Shower/Tub: Tub/shower unit         Home Equipment: None          Prior Functioning/Environment Level of Independence: Independent        Comments: Works full time, is very active        OT Problem List:        OT Treatment/Interventions:      OT Goals(Current goals can be found in the care plan section) Acute Rehab OT Goals Patient Stated Goal: To return to workl OT Goal Formulation: With patient/family Time For Goal Achievement: 03/18/20 Potential to Achieve Goals: Good  OT Frequency:     Barriers to D/C:            Co-evaluation              AM-PAC OT "6 Clicks" Daily Activity     Outcome Measure Help from another person eating meals?: None Help from another person taking care of personal grooming?: None Help from another person toileting, which includes using toliet, bedpan, or urinal?: None Help from another person bathing (including washing, rinsing, drying)?: A Little Help from another person to put on and taking off regular upper body clothing?: None Help from another person to put on and taking off regular lower body clothing?: A Little 6 Click Score: 22   End of Session Nurse Communication: Mobility status  Activity Tolerance: Patient tolerated treatment well Patient left: in bed;with call bell/phone within reach;with family/visitor present  OT Visit Diagnosis: Other abnormalities of gait and mobility (R26.89)                Time: 4235-3614 OT Time Calculation (min): 18 min Charges:  OT General Charges $OT Visit: 1 Visit OT Evaluation $OT Eval Low Complexity: 1 Low OT Treatments $Self Care/Home Management : 8-22 mins  Kathie Dike, M.S. OTR/L  03/04/20, 1:31 PM

## 2020-03-04 NOTE — ED Notes (Signed)
Pt returns to room from MRI

## 2020-03-04 NOTE — Progress Notes (Signed)
SLP Cancellation Note  Patient Details Name: Dean Maldonado MRN: [id number] DOB: 1946/05/26   Cancelled treatment:       Reason Eval/Treat Not Completed: SLP screened, no needs identified, will sign off (chart reviewed; consulted pt/NSG). Pt denied any difficulty swallowing and is currently on a regular diet; tolerates swallowing pills w/ water per NSG. Pt conversed in conversation w/out deficits noted; pt denied any speech-language deficits. Speech clear. He chose items for his dinner meal - SLP assisted in placing order to Dining services.  No further skilled ST services indicated as pt appears at his baseline. Pt agreed. NSG to reconsult if any change in status while admitted.    Jerilynn Som, MS, CCC-SLP Jurrell Royster 03/04/2020, 5:43 PM

## 2020-03-04 NOTE — ED Notes (Signed)
Pt actively vomiting; st went to bed at 11pm and awoke approx 1am with dizziness, N/V; pt denies hx of same; denies any other accomp symptoms; denies any recent illness

## 2020-03-04 NOTE — Progress Notes (Signed)
*  PRELIMINARY RESULTS* Echocardiogram 2D Echocardiogram has been performed.  Dean Maldonado 03/04/2020, 9:40 AM

## 2020-03-04 NOTE — ED Notes (Signed)
Pt to CT via stretcher accomp by CT tech and nurse

## 2020-03-04 NOTE — H&P (Addendum)
[facility]   PATIENT NAME: Dean Maldonado    MR#:  [id number]  DATE OF BIRTH:  1946-01-28  DATE OF ADMISSION:  03/04/2020  PRIMARY CARE PHYSICIAN: Sandrea Hughs, NP   REQUESTING/REFERRING PHYSICIAN: Cecil Cobbs, MD  CHIEF COMPLAINT:   Chief Complaint  Patient presents with  . Dizziness    HISTORY OF PRESENT ILLNESS:  Dean Maldonado  is a 74 y.o. male with a known history of hypertension, presented to the emergency room with acute onset of vertigo with associated vomiting that started before he came to the ER.  He has been feeling weak all over and denies any paresthesias.  He denied any headache or dizziness or blurred vision or diplopia.  No dysphagia or dysarthria tinnitus or focal muscle weakness.  He admitted to polyuria polydipsia.  No dysuria, or hematuria or flank pain.  No cough or wheezing.  No chest pain or palpitations.  Upon presentation to the emergency room, blood pressure was 145/82 with otherwise normal vital signs.  Labs revealed hyperglycemia of 529 with otherwise unremarkable CMP. COVID-19 PCR came back negative.  Urinalysis showed more than 500 glucose and specifically 81,032.  Alcohol levels less than 10.  Urine drug screen came back negative.  Noncontrasted head CT scan revealed no acute internal cranial abnormalities.  CT a of the head and neck revealed no occlusion or high-grade stenosis. EKG shows sinus rhythm with a rate of 87 with left atrial enlargement is currently.    The patient was given 4 of aspirin, 1 L of lactated Ringer, 1 mg of IV Ativan and 4 mg of IV Zofran.  He will be admitted to an observation medically monitored bed for further evaluation and management.  PAST MEDICAL HISTORY:   Past Medical History:  Diagnosis Date  . Hypertension     PAST SURGICAL HISTORY:  History reviewed. No pertinent surgical history.  No reported previous surgeries. SOCIAL HISTORY:   Social History   Tobacco Use  .  Smoking status: Never Smoker  . Smokeless tobacco: Never Used  Substance Use Topics  . Alcohol use: Not on file    FAMILY HISTORY:  No family history on file.  No pertinent familial diseases.  DRUG ALLERGIES:  No Known Allergies  REVIEW OF SYSTEMS:   ROS As per history of present illness. All pertinent systems were reviewed above. Constitutional, HEENT, cardiovascular, respiratory, GI, GU, musculoskeletal, neuro, psychiatric, endocrine, integumentary and hematologic systems were reviewed and are otherwise negative/unremarkable except for positive findings mentioned above in the HPI.   MEDICATIONS AT HOME:   Prior to Admission medications   Medication Sig Start Date End Date Taking? Authorizing Provider  aspirin 81 MG EC tablet Take by mouth.   Yes [provider]  atorvastatin (LIPITOR) 20 MG tablet Take 20 mg by mouth daily. 11/27/19  Yes [provider]  lisinopril-hydrochlorothiazide (ZESTORETIC) 20-12.5 MG tablet Take 1 tablet by mouth daily. 09/10/19  Yes [provider]  pantoprazole (PROTONIX) 40 MG tablet Take 40 mg by mouth daily. 01/01/20  Yes [provider]  tamsulosin (FLOMAX) 0.4 MG CAPS capsule Take 0.4 mg by mouth daily. 01/01/20  Yes [provider]      VITAL SIGNS:  Blood pressure 133/71, pulse 80, temperature (!) 97.4 F (36.3 C), temperature source Oral, resp. rate 20, weight 100.8 kg, SpO2 93 %.  PHYSICAL EXAMINATION:  Physical Exam  GENERAL:  74 y.o.-year-old male male patient lying in the bed with no acute  distress.  EYES: Pupils equal, round, reactive to light and accommodation. No scleral icterus. Extraocular muscles intact.  HEENT: Head atraumatic, normocephalic. Oropharynx and nasopharynx clear.  NECK:  Supple, no jugular venous distention. No thyroid enlargement, no tenderness.  LUNGS: Normal breath sounds bilaterally, no wheezing, rales,rhonchi or crepitation. No use of accessory muscles of respiration.   CARDIOVASCULAR: Regular rate and rhythm, S1, S2 normal. No murmurs, rubs, or gallops.  ABDOMEN: Soft, nondistended, nontender. Bowel sounds present. No organomegaly or mass.  EXTREMITIES: No pedal edema, cyanosis, or clubbing.  NEUROLOGIC: Cranial nerves II through XII are intact. Muscle strength 5/5 in all extremities. Sensation intact. Gait not checked.  No apparent nystagmus currently. PSYCHIATRIC: The patient is alert and oriented x 3.  Normal affect and good eye contact. SKIN: No obvious rash, lesion, or ulcer.   LABORATORY PANEL:   CBC Recent Labs  Lab 03/04/20 0155  WBC 11.3*  HGB 14.6  HCT 44.2  PLT 213   ------------------------------------------------------------------------------------------------------------------  Chemistries  Recent Labs  Lab 03/04/20 0237  NA 135  K 4.4  CL 99  CO2 28  GLUCOSE 529*  BUN 16  CREATININE 0.85  CALCIUM 9.3  AST 26  ALT 38  ALKPHOS 150*  BILITOT 0.7   ------------------------------------------------------------------------------------------------------------------  Cardiac Enzymes No results for input(s): TROPONINI in the last 168 hours. ------------------------------------------------------------------------------------------------------------------  RADIOLOGY:  CT ANGIO HEAD W OR WO CONTRAST  Result Date: 03/04/2020 CLINICAL DATA:  Vertigo EXAM: CT ANGIOGRAPHY HEAD AND NECK TECHNIQUE: Multidetector CT imaging of the head and neck was performed using the standard protocol during bolus administration of intravenous contrast. Multiplanar CT image reconstructions and MIPs were obtained to evaluate the vascular anatomy. Carotid stenosis measurements (when applicable) are obtained utilizing NASCET criteria, using the distal internal carotid diameter as the denominator. CONTRAST:  75mL OMNIPAQUE IOHEXOL 350 MG/ML SOLN COMPARISON:  None. FINDINGS: CTA NECK FINDINGS SKELETON: There is no bony spinal canal stenosis. No lytic or  blastic lesion. OTHER NECK: Normal pharynx, larynx and major salivary glands. No cervical lymphadenopathy. Unremarkable thyroid gland. UPPER CHEST: Mild biapical interstitial opacity. AORTIC ARCH: There is mild calcific atherosclerosis of the aortic arch. There is no aneurysm, dissection or hemodynamically significant stenosis of the visualized portion of the aorta. Conventional 3 vessel aortic branching pattern. The visualized proximal subclavian arteries are widely patent. RIGHT CAROTID SYSTEM: Normal without aneurysm, dissection or stenosis. LEFT CAROTID SYSTEM: Normal without aneurysm, dissection or stenosis. VERTEBRAL ARTERIES: Left dominant configuration. Both origins are clearly patent. There is no dissection, occlusion or flow-limiting stenosis to the skull base (V1-V3 segments). CTA HEAD FINDINGS POSTERIOR CIRCULATION: --Vertebral arteries: Normal V4 segments. --Inferior cerebellar arteries: Normal. --Basilar artery: Normal. --Superior cerebellar arteries: Normal. --Posterior cerebral arteries (PCA): Normal. ANTERIOR CIRCULATION: --Intracranial internal carotid arteries: Normal. --Anterior cerebral arteries (ACA): Normal. Both A1 segments are present. Patent anterior communicating artery (a-comm). --Middle cerebral arteries (MCA): Normal. VENOUS SINUSES: As permitted by contrast timing, patent. ANATOMIC VARIANTS: None Review of the MIP images confirms the above findings. IMPRESSION: 1. No emergent large vessel occlusion or high-grade stenosis of the intracranial arteries. 2. Aortic Atherosclerosis (ICD10-I70.0). Electronically Signed   By: Deatra RobinsonKevin  Herman M.D.   On: 03/04/2020 03:50   CT ANGIO NECK W OR WO CONTRAST  Result Date: 03/04/2020 CLINICAL DATA:  Vertigo EXAM: CT ANGIOGRAPHY HEAD AND NECK TECHNIQUE: Multidetector CT imaging of the head and neck was performed using the standard protocol during bolus administration of intravenous contrast. Multiplanar CT image reconstructions and MIPs were obtained  to evaluate  the vascular anatomy. Carotid stenosis measurements (when applicable) are obtained utilizing NASCET criteria, using the distal internal carotid diameter as the denominator. CONTRAST:  43mL OMNIPAQUE IOHEXOL 350 MG/ML SOLN COMPARISON:  None. FINDINGS: CTA NECK FINDINGS SKELETON: There is no bony spinal canal stenosis. No lytic or blastic lesion. OTHER NECK: Normal pharynx, larynx and major salivary glands. No cervical lymphadenopathy. Unremarkable thyroid gland. UPPER CHEST: Mild biapical interstitial opacity. AORTIC ARCH: There is mild calcific atherosclerosis of the aortic arch. There is no aneurysm, dissection or hemodynamically significant stenosis of the visualized portion of the aorta. Conventional 3 vessel aortic branching pattern. The visualized proximal subclavian arteries are widely patent. RIGHT CAROTID SYSTEM: Normal without aneurysm, dissection or stenosis. LEFT CAROTID SYSTEM: Normal without aneurysm, dissection or stenosis. VERTEBRAL ARTERIES: Left dominant configuration. Both origins are clearly patent. There is no dissection, occlusion or flow-limiting stenosis to the skull base (V1-V3 segments). CTA HEAD FINDINGS POSTERIOR CIRCULATION: --Vertebral arteries: Normal V4 segments. --Inferior cerebellar arteries: Normal. --Basilar artery: Normal. --Superior cerebellar arteries: Normal. --Posterior cerebral arteries (PCA): Normal. ANTERIOR CIRCULATION: --Intracranial internal carotid arteries: Normal. --Anterior cerebral arteries (ACA): Normal. Both A1 segments are present. Patent anterior communicating artery (a-comm). --Middle cerebral arteries (MCA): Normal. VENOUS SINUSES: As permitted by contrast timing, patent. ANATOMIC VARIANTS: None Review of the MIP images confirms the above findings. IMPRESSION: 1. No emergent large vessel occlusion or high-grade stenosis of the intracranial arteries. 2. Aortic Atherosclerosis (ICD10-I70.0). Electronically Signed   By: Deatra Robinson M.D.   On:  03/04/2020 03:50   MR BRAIN WO CONTRAST  Result Date: 03/04/2020 CLINICAL DATA:  Vertigo. EXAM: MRI HEAD WITHOUT CONTRAST TECHNIQUE: Multiplanar, multiecho pulse sequences of the brain and surrounding structures were obtained without intravenous contrast. COMPARISON:  Head CT and CTA 03/04/2020 FINDINGS: Some sequences are mildly to moderately motion degraded despite the use of faster, more motion resistant imaging protocols. Brain: There is no evidence of acute infarct, intracranial hemorrhage, mass, midline shift, or extra-axial fluid collection. T2 hyperintensities in the cerebral white matter bilaterally are nonspecific but compatible with mild chronic small vessel ischemic disease. The ventricles and sulci are within normal limits for age. Vascular: Major intracranial vascular flow voids are preserved. Skull and upper cervical spine: Unremarkable bone marrow signal. Sinuses/Orbits: Unremarkable orbits. Paranasal sinuses and mastoid air cells are clear. Other: None. IMPRESSION: 1. No acute intracranial abnormality. 2. Mild chronic small vessel ischemic disease. Electronically Signed   By: Sebastian Ache M.D.   On: 03/04/2020 05:54   CT HEAD CODE STROKE WO CONTRAST`  Result Date: 03/04/2020 CLINICAL DATA:  Code stroke.  Central vertigo EXAM: CT HEAD WITHOUT CONTRAST TECHNIQUE: Contiguous axial images were obtained from the base of the skull through the vertex without intravenous contrast. COMPARISON:  None. FINDINGS: Brain: There is no mass, hemorrhage or extra-axial collection. The size and configuration of the ventricles and extra-axial CSF spaces are normal. The brain parenchyma is normal, without evidence of acute or chronic infarction. Vascular: No abnormal hyperdensity of the major intracranial arteries or dural venous sinuses. No intracranial atherosclerosis. Skull: The visualized skull base, calvarium and extracranial soft tissues are normal. Sinuses/Orbits: No fluid levels or advanced mucosal  thickening of the visualized paranasal sinuses. No mastoid or middle ear effusion. The orbits are normal. ASPECTS [facility] Stroke Program Early CT Score) - Ganglionic level infarction (caudate, lentiform nuclei, internal capsule, insula, M1-M3 cortex): 7 - Supraganglionic infarction (M4-M6 cortex): 3 Total score (0-10 with 10 being normal): 10 IMPRESSION: 1. Normal head CT. 2. ASPECTS is 10.  These results were called by telephone at the time of interpretation on 03/04/2020 at 2:25 am to provider [facility] , who verbally acknowledged these results. Electronically Signed   By: Deatra Robinson M.D.   On: 03/04/2020 02:27      IMPRESSION AND PLAN:   1.  TIA with associated nystagmus and vertigo. -The patient will be admitted to a an observation medical monitored bed. -Neurochecks will be obtained every 4 hours for 24 hours. -Patient be placed on aspirin. -We will check fasting lipids and place the patient on statin therapy.  2.  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 -The patient will be placed on frequent fingerstick blood glucose measures with resistant subcutaneous NovoLog coverage. -Will check his hemoglobin A1c. -He will receive diabetic education here.  3.  Dyslipidemia. -We will continue statin therapy and check fasting lipids.  4.  GERD. -We will continue PPI therapy and H2 blocker therapy.  5.  BPH. -We will continue Flomax.  6.  DVT prophylaxis. -Subcutaneous Lovenox.  All the records are reviewed and case discussed with ED provider. The plan of care was discussed in details with the patient (and family). I answered all questions. The patient agreed to proceed with the above mentioned plan. Further management will depend upon hospital course.   CODE STATUS: Full code  Status is: Observation  The patient remains OBS appropriate and will d/c before 2 midnights.  Dispo: The patient is from: Home              Anticipated d/c is to:  Home              Anticipated d/c date is: 1 day              Patient currently is not medically stable to d/c.   TOTAL TIME TAKING CARE OF THIS PATIENT: 50 minutes.    Hannah Beat M.D on 03/04/2020 at 6:34 AM  [facility]   From 7 PM-7 AM, contact night-coverage [email or web address]  CC: Primary care physician; Sandrea Hughs, NP   Note: This dictation was prepared with Dragon dictation along with smaller phrase technology. Any transcriptional typo errors that result from this process are unintentional.

## 2020-03-04 NOTE — Progress Notes (Signed)
PROGRESS NOTE    Dean Maldonado  DVV:[id number] DOB: [birth year] DOA: 03/04/2020 PCP: Freddy Finner, NP   Chief complaint.  Dizziness.   Brief Narrative:  Dean Maldonado  is a 75 y.o. male with a known history of hypertension, presented to the emergency room with acute onset of vertigo with associated vomiting that started before he came to the ER.  Patient is seen by teleneurology, MRI of the brain did not show any acute changes.  CT angiogram of the neck and the head did not show significant occlusion.  Patient was a found to have a glucose of 529.  He never had a diagnosed with diabetes in the past.  Insulin was started   Assessment & Plan:   Active Problems:   Dizziness  #1.  Vertigo. Probably from extreme hyperglycemia.  Neurology has seen the patient, patient does not seem to have a stroke.  He received IV fluids and insulin, vertigo symptom is improved.  2.  Uncontrolled type 2 diabetes with hyperglycemia and hyperosmolality. Patient does not have altered mental status.  He received IV fluids, he was also receiving sliding scale insulin, I added Humalog 70/30 at 28 units twice a day, will continue adjust dose.  We will also look at hemoglobin A1c ordered for tomorrow.  3.  Essential hypertension. Continue current treatment.   DVT prophylaxis: Lovenox Code Status: Full Family Communication: None Disposition Plan:  . Patient came from: home            . Anticipated d/c place: Home . Barriers to d/c OR conditions which need to be met to effect a safe d/c:   Consultants:   Neurology  Procedures:None Antimicrobials:None  Subjective: Patient dizziness getting better today.  Denies any short of breath, no cough.  No abdominal pain or nausea vomiting.  No fever chills.  Objective: Vitals:   03/04/20 0600 03/04/20 0654 03/04/20 1100 03/04/20 1200  BP: 133/71   (!) 147/80  Pulse:    97  Resp: 20   (!) 21  Temp:  98.7 F (37.1 C)    TempSrc:  Oral    SpO2:    95% 93%  Weight:       No intake or output data in the 24 hours ending 03/04/20 1520 Filed Weights   03/04/20 0145  Weight: 100.8 kg    Examination:  General exam: Appears calm and comfortable  Respiratory system: Clear to auscultation. Respiratory effort normal. Cardiovascular system: S1 & S2 heard, RRR. No JVD, murmurs, rubs, gallops or clicks. No pedal edema. Gastrointestinal system: Abdomen is nondistended, soft and nontender. No organomegaly or masses felt. Normal bowel sounds heard. Central nervous system: Alert and oriented. No focal neurological deficits. Extremities: Symmetric 5 x 5 power. Skin: No rashes, lesions or ulcers Psychiatry: Judgement and insight appear normal. Mood & affect appropriate.     Data Reviewed: I have personally reviewed following labs and imaging studies  CBC: Recent Labs  Lab 03/04/20 0155  WBC 11.3*  NEUTROABS 7.6  HGB 14.6  HCT 44.2  MCV 94.0  PLT 626   Basic Metabolic Panel: Recent Labs  Lab 03/04/20 0237  NA 135  K 4.4  CL 99  CO2 28  GLUCOSE 529*  BUN 16  CREATININE 0.85  CALCIUM 9.3   GFR: CrCl cannot be calculated (Unknown ideal weight.). Liver Function Tests: Recent Labs  Lab 03/04/20 0237  AST 26  ALT 38  ALKPHOS 150*  BILITOT 0.7  PROT 7.7  ALBUMIN 4.0  No results for input(s): LIPASE, AMYLASE in the last 168 hours. No results for input(s): AMMONIA in the last 168 hours. Coagulation Profile: Recent Labs  Lab 03/04/20 0237  INR 0.9   Cardiac Enzymes: No results for input(s): CKTOTAL, CKMB, CKMBINDEX, TROPONINI in the last 168 hours. BNP (last 3 results) No results for input(s): PROBNP in the last 8760 hours. HbA1C: No results for input(s): HGBA1C in the last 72 hours. CBG: Recent Labs  Lab 03/04/20 0810 03/04/20 1017 03/04/20 1240  GLUCAP 420* 598* 366*   Lipid Profile: Recent Labs    03/04/20 0237  CHOL 140  HDL 50  LDLCALC 71  TRIG 95  CHOLHDL 2.8   Thyroid Function Tests: No  results for input(s): TSH, T4TOTAL, FREET4, T3FREE, THYROIDAB in the last 72 hours. Anemia Panel: No results for input(s): VITAMINB12, FOLATE, FERRITIN, TIBC, IRON, RETICCTPCT in the last 72 hours. Sepsis Labs: No results for input(s): PROCALCITON, LATICACIDVEN in the last 168 hours.  Recent Results (from the past 240 hour(s))  SARS Coronavirus 2 by RT PCR (hospital order, performed in [facility] hospital lab) Nasopharyngeal Nasopharyngeal Swab     Status: None   Collection Time: 03/04/20  4:14 AM   Specimen: Nasopharyngeal Swab  Result Value Ref Range Status   SARS Coronavirus 2 NEGATIVE NEGATIVE Final    Comment: (NOTE) SARS-CoV-2 target nucleic acids are NOT DETECTED.  The SARS-CoV-2 RNA is generally detectable in upper and lower respiratory specimens during the acute phase of infection. The lowest concentration of SARS-CoV-2 viral copies this assay can detect is 250 copies / mL. A negative result does not preclude SARS-CoV-2 infection and should not be used as the sole basis for treatment or other patient management decisions.  A negative result may occur with improper specimen collection / handling, submission of specimen other than nasopharyngeal swab, presence of viral mutation(s) within the areas targeted by this assay, and inadequate number of viral copies (<250 copies / mL). A negative result must be combined with clinical observations, patient history, and epidemiological information.  Fact Sheet for Patients:   BoilerBrush.com.cy  Fact Sheet for Healthcare Providers: https://pope.com/  This test is not yet approved or  cleared by the [place] FDA and has been authorized for detection and/or diagnosis of SARS-CoV-2 by FDA under an Emergency Use Authorization (EUA).  This EUA will remain in effect (meaning this test can be used) for the duration of the COVID-19 declaration under Section 564(b)(1) of the Act, 21  U.S.C. section 360bbb-3(b)(1), unless the authorization is terminated or revoked sooner.  Performed at [facility], [address] [place], [place] [address]          Radiology Studies: CT ANGIO HEAD W OR WO CONTRAST  Result Date: 03/04/2020 CLINICAL DATA:  Vertigo EXAM: CT ANGIOGRAPHY HEAD AND NECK TECHNIQUE: Multidetector CT imaging of the head and neck was performed using the standard protocol during bolus administration of intravenous contrast. Multiplanar CT image reconstructions and MIPs were obtained to evaluate the vascular anatomy. Carotid stenosis measurements (when applicable) are obtained utilizing NASCET criteria, using the distal internal carotid diameter as the denominator. CONTRAST:  39mL OMNIPAQUE IOHEXOL 350 MG/ML SOLN COMPARISON:  None. FINDINGS: CTA NECK FINDINGS SKELETON: There is no bony spinal canal stenosis. No lytic or blastic lesion. OTHER NECK: Normal pharynx, larynx and major salivary glands. No cervical lymphadenopathy. Unremarkable thyroid gland. UPPER CHEST: Mild biapical interstitial opacity. AORTIC ARCH: There is mild calcific atherosclerosis of the aortic arch. There is no aneurysm, dissection  or hemodynamically significant stenosis of the visualized portion of the aorta. Conventional 3 vessel aortic branching pattern. The visualized proximal subclavian arteries are widely patent. RIGHT CAROTID SYSTEM: Normal without aneurysm, dissection or stenosis. LEFT CAROTID SYSTEM: Normal without aneurysm, dissection or stenosis. VERTEBRAL ARTERIES: Left dominant configuration. Both origins are clearly patent. There is no dissection, occlusion or flow-limiting stenosis to the skull base (V1-V3 segments). CTA HEAD FINDINGS POSTERIOR CIRCULATION: --Vertebral arteries: Normal V4 segments. --Inferior cerebellar arteries: Normal. --Basilar artery: Normal. --Superior cerebellar arteries: Normal. --Posterior cerebral arteries (PCA): Normal. ANTERIOR CIRCULATION:  --Intracranial internal carotid arteries: Normal. --Anterior cerebral arteries (ACA): Normal. Both A1 segments are present. Patent anterior communicating artery (a-comm). --Middle cerebral arteries (MCA): Normal. VENOUS SINUSES: As permitted by contrast timing, patent. ANATOMIC VARIANTS: None Review of the MIP images confirms the above findings. IMPRESSION: 1. No emergent large vessel occlusion or high-grade stenosis of the intracranial arteries. 2. Aortic Atherosclerosis (ICD10-I70.0). Electronically Signed   By: Ulyses Jarred M.D.   On: 03/04/2020 03:50   CT ANGIO NECK W OR WO CONTRAST  Result Date: 03/04/2020 CLINICAL DATA:  Vertigo EXAM: CT ANGIOGRAPHY HEAD AND NECK TECHNIQUE: Multidetector CT imaging of the head and neck was performed using the standard protocol during bolus administration of intravenous contrast. Multiplanar CT image reconstructions and MIPs were obtained to evaluate the vascular anatomy. Carotid stenosis measurements (when applicable) are obtained utilizing NASCET criteria, using the distal internal carotid diameter as the denominator. CONTRAST:  42m OMNIPAQUE IOHEXOL 350 MG/ML SOLN COMPARISON:  None. FINDINGS: CTA NECK FINDINGS SKELETON: There is no bony spinal canal stenosis. No lytic or blastic lesion. OTHER NECK: Normal pharynx, larynx and major salivary glands. No cervical lymphadenopathy. Unremarkable thyroid gland. UPPER CHEST: Mild biapical interstitial opacity. AORTIC ARCH: There is mild calcific atherosclerosis of the aortic arch. There is no aneurysm, dissection or hemodynamically significant stenosis of the visualized portion of the aorta. Conventional 3 vessel aortic branching pattern. The visualized proximal subclavian arteries are widely patent. RIGHT CAROTID SYSTEM: Normal without aneurysm, dissection or stenosis. LEFT CAROTID SYSTEM: Normal without aneurysm, dissection or stenosis. VERTEBRAL ARTERIES: Left dominant configuration. Both origins are clearly patent. There is  no dissection, occlusion or flow-limiting stenosis to the skull base (V1-V3 segments). CTA HEAD FINDINGS POSTERIOR CIRCULATION: --Vertebral arteries: Normal V4 segments. --Inferior cerebellar arteries: Normal. --Basilar artery: Normal. --Superior cerebellar arteries: Normal. --Posterior cerebral arteries (PCA): Normal. ANTERIOR CIRCULATION: --Intracranial internal carotid arteries: Normal. --Anterior cerebral arteries (ACA): Normal. Both A1 segments are present. Patent anterior communicating artery (a-comm). --Middle cerebral arteries (MCA): Normal. VENOUS SINUSES: As permitted by contrast timing, patent. ANATOMIC VARIANTS: None Review of the MIP images confirms the above findings. IMPRESSION: 1. No emergent large vessel occlusion or high-grade stenosis of the intracranial arteries. 2. Aortic Atherosclerosis (ICD10-I70.0). Electronically Signed   By: KUlyses JarredM.D.   On: 03/04/2020 03:50   MR BRAIN WO CONTRAST  Result Date: 03/04/2020 CLINICAL DATA:  Vertigo. EXAM: MRI HEAD WITHOUT CONTRAST TECHNIQUE: Multiplanar, multiecho pulse sequences of the brain and surrounding structures were obtained without intravenous contrast. COMPARISON:  Head CT and CTA 03/04/2020 FINDINGS: Some sequences are mildly to moderately motion degraded despite the use of faster, more motion resistant imaging protocols. Brain: There is no evidence of acute infarct, intracranial hemorrhage, mass, midline shift, or extra-axial fluid collection. T2 hyperintensities in the cerebral white matter bilaterally are nonspecific but compatible with mild chronic small vessel ischemic disease. The ventricles and sulci are within normal limits for age. Vascular: Major intracranial vascular flow voids  are preserved. Skull and upper cervical spine: Unremarkable bone marrow signal. Sinuses/Orbits: Unremarkable orbits. Paranasal sinuses and mastoid air cells are clear. Other: None. IMPRESSION: 1. No acute intracranial abnormality. 2. Mild chronic small  vessel ischemic disease. Electronically Signed   By: Logan Bores M.D.   On: 03/04/2020 05:54   ECHOCARDIOGRAM COMPLETE  Result Date: 03/04/2020    ECHOCARDIOGRAM REPORT   Patient Name:   ROBERTS BON Clanton Date of Exam: 03/04/2020 Medical Rec #:  [id number]          Height: Accession #:    [id number]         Weight:       222.2 lb Date of Birth:  [birth year]         BSA:          2.166 m Patient Age:    43 years           BP:           133/71 mmHg Patient Gender: M                  HR:           81 bpm. Exam Location:  [facility] Procedure: 2D Echo, Color Doppler and Cardiac Doppler Indications:     G45.9 TIA  History:         Patient has no prior history of Echocardiogram examinations.                  Risk Factors:Hypertension.  Sonographer:     Charmayne Sheer RDCS (AE) Referring Phys:  [id number] Arvella Merles [place] Diagnosing Phys: Serafina Royals MD  Sonographer Comments: Suboptimal parasternal window and no subcostal window. IMPRESSIONS  1. Left ventricular ejection fraction, by estimation, is 50 to 55%. The left ventricle has low normal function. The left ventricle has no regional wall motion abnormalities. Left ventricular diastolic parameters were normal.  2. Right ventricular systolic function is normal. The right ventricular size is normal.  3. The mitral valve is normal in structure. Trivial mitral valve regurgitation.  4. The aortic valve is normal in structure. Aortic valve regurgitation is not visualized. FINDINGS  Left Ventricle: Left ventricular ejection fraction, by estimation, is 50 to 55%. The left ventricle has low normal function. The left ventricle has no regional wall motion abnormalities. The left ventricular internal cavity size was normal in size. There is no left ventricular hypertrophy. Left ventricular diastolic parameters were normal. Right Ventricle: The right ventricular size is normal. No increase in right ventricular wall thickness. Right ventricular systolic function is normal. Left Atrium: Left  atrial size was normal in size. Right Atrium: Right atrial size was normal in size. Pericardium: There is no evidence of pericardial effusion. Mitral Valve: The mitral valve is normal in structure. Trivial mitral valve regurgitation. MV peak gradient, 6.8 mmHg. The mean mitral valve gradient is 2.0 mmHg. Tricuspid Valve: The tricuspid valve is normal in structure. Tricuspid valve regurgitation is trivial. Aortic Valve: The aortic valve is normal in structure. Aortic valve regurgitation is not visualized. Aortic valve mean gradient measures 2.0 mmHg. Aortic valve peak gradient measures 4.6 mmHg. Aortic valve area, by VTI measures 4.49 cm. Pulmonic Valve: The pulmonic valve was normal in structure. Pulmonic valve regurgitation is not visualized. Aorta: The aortic root and ascending aorta are structurally normal, with no evidence of dilitation. IAS/Shunts: No atrial level shunt detected by color flow Doppler.  LEFT VENTRICLE PLAX 2D LVIDd:  4.41 cm  Diastology LVIDs:         3.33 cm  LV e' lateral:   10.10 cm/s LV PW:         1.03 cm  LV E/e' lateral: 8.9 LV IVS:        1.02 cm  LV e' medial:    7.07 cm/s LVOT diam:     2.60 cm  LV E/e' medial:  12.8 LV SV:         102 LV SV Index:   47 LVOT Area:     5.31 cm  LEFT ATRIUM             Index LA diam:        3.30 cm 1.52 cm/m LA Vol (A2C):   28.5 ml 13.16 ml/m LA Vol (A4C):   26.6 ml 12.28 ml/m LA Biplane Vol: 28.0 ml 12.93 ml/m  AORTIC VALVE                   PULMONIC VALVE AV Area (Vmax):    4.44 cm    PV Vmax:       0.90 m/s AV Area (Vmean):   4.34 cm    PV Vmean:      53.800 cm/s AV Area (VTI):     4.49 cm    PV VTI:        0.181 m AV Vmax:           107.00 cm/s PV Peak grad:  3.2 mmHg AV Vmean:          71.000 cm/s PV Mean grad:  1.0 mmHg AV VTI:            0.227 m AV Peak Grad:      4.6 mmHg AV Mean Grad:      2.0 mmHg LVOT Vmax:         89.40 cm/s LVOT Vmean:        58.100 cm/s LVOT VTI:          0.192 m LVOT/AV VTI ratio: 0.85  AORTA Ao Root diam:  4.40 cm MITRAL VALVE MV Area (PHT): 6.96 cm     SHUNTS MV Peak grad:  6.8 mmHg     Systemic VTI:  0.19 m MV Mean grad:  2.0 mmHg     Systemic Diam: 2.60 cm MV Vmax:       1.30 m/s MV Vmean:      68.0 cm/s MV Decel Time: 109 msec MV E velocity: 90.20 cm/s MV A velocity: 134.00 cm/s MV E/A ratio:  0.67 Serafina Royals MD Electronically signed by Serafina Royals MD Signature Date/Time: 03/04/2020/1:04:08 PM    Final    CT HEAD CODE STROKE WO CONTRAST`  Result Date: 03/04/2020 CLINICAL DATA:  Code stroke.  Central vertigo EXAM: CT HEAD WITHOUT CONTRAST TECHNIQUE: Contiguous axial images were obtained from the base of the skull through the vertex without intravenous contrast. COMPARISON:  None. FINDINGS: Brain: There is no mass, hemorrhage or extra-axial collection. The size and configuration of the ventricles and extra-axial CSF spaces are normal. The brain parenchyma is normal, without evidence of acute or chronic infarction. Vascular: No abnormal hyperdensity of the major intracranial arteries or dural venous sinuses. No intracranial atherosclerosis. Skull: The visualized skull base, calvarium and extracranial soft tissues are normal. Sinuses/Orbits: No fluid levels or advanced mucosal thickening of the visualized paranasal sinuses. No mastoid or middle ear effusion. The orbits are normal. ASPECTS [facility] Stroke Program Early CT Score) - Ganglionic  level infarction (caudate, lentiform nuclei, internal capsule, insula, M1-M3 cortex): 7 - Supraganglionic infarction (M4-M6 cortex): 3 Total score (0-10 with 10 being normal): 10 IMPRESSION: 1. Normal head CT. 2. ASPECTS is 10. These results were called by telephone at the time of interpretation on 03/04/2020 at 2:25 am to provider [facility] , who verbally acknowledged these results. Electronically Signed   By: Ulyses Jarred M.D.   On: 03/04/2020 02:27        Scheduled Meds: .  stroke: mapping our early stages of recovery book   Does not apply Once  .  enoxaparin (LOVENOX) injection  40 mg Subcutaneous Q24H  . insulin aspart  0-9 Units Subcutaneous TID PC & HS  . insulin aspart protamine- aspart  28 Units Subcutaneous BID WC  . insulin starter kit- pen needles  1 kit Other Once  . living well with diabetes book   Does not apply Once   Continuous Infusions: . sodium chloride 100 mL/hr at 03/04/20 0647     LOS: 0 days    Time spent: 28 minutes    Sharen Hones, MD [facility]   To contact the attending provider between 7A-7P or the covering provider during after hours 7P-7A, please log into the web site [email or web address] and access using universal [place] password for that web site. If you do not have the password, please call the hospital operator.  03/04/2020, 3:20 PM

## 2020-03-04 NOTE — ED Notes (Signed)
Pt to CT via stretcher accomp by CT tech 

## 2020-03-04 NOTE — Consult Note (Signed)
Reason for Consult:Dizziness Requesting Physician: Roosevelt Locks  CC: Dizziness  I have been asked by Dr. Roosevelt Locks to see this patient in consultation for dizziness.  HPI: Dean Maldonado is an 74 y.o. male with a known history of hypertension, presented to the emergency room with acute onset of vertigo with associated vomiting that started before he came to the ER.  Patient was at baseline before taking a nap. When he awakened he felt vertiginous and weak.  Was off balance with gait.  Had nausea and vomiting.  On arrival to the ED blood sugar was elevated at >500.   Overnight symptoms resolved.     Past Medical History:  Diagnosis Date  . Hypertension     History reviewed. No pertinent surgical history.  History reviewed. No pertinent family history.  Social History:  reports that he has never smoked. He has never used smokeless tobacco. No history on file for alcohol use and drug use.  No Known Allergies  Medications:  I have reviewed the patient's current medications. Prior to Admission:  Medications Prior to Admission  Medication Sig Dispense Refill Last Dose  . aspirin 81 MG EC tablet Take by mouth.   03/03/2020 at Unknown time  . atorvastatin (LIPITOR) 20 MG tablet Take 20 mg by mouth daily.   03/03/2020 at Unknown time  . lisinopril-hydrochlorothiazide (ZESTORETIC) 20-12.5 MG tablet Take 1 tablet by mouth daily.   03/03/2020 at Unknown time  . pantoprazole (PROTONIX) 40 MG tablet Take 40 mg by mouth daily.   03/03/2020 at Unknown time  . tamsulosin (FLOMAX) 0.4 MG CAPS capsule Take 0.4 mg by mouth daily.   03/03/2020 at Unknown time   Scheduled: .  stroke: mapping our early stages of recovery book   Does not apply Once  . enoxaparin (LOVENOX) injection  40 mg Subcutaneous Q24H  . insulin aspart  0-9 Units Subcutaneous TID PC & HS  . insulin aspart protamine- aspart  28 Units Subcutaneous BID WC  . insulin starter kit- pen needles  1 kit Other Once  . living well with diabetes book    Does not apply Once    ROS: History obtained from the patient  General ROS: negative for - chills, fatigue, fever, night sweats, weight gain or weight loss Psychological ROS: negative for - behavioral disorder, hallucinations, memory difficulties, mood swings or suicidal ideation Ophthalmic ROS: negative for - blurry vision, double vision, eye pain or loss of vision ENT ROS: vertigo Allergy and Immunology ROS: negative for - hives or itchy/watery eyes Hematological and Lymphatic ROS: negative for - bleeding problems, bruising or swollen lymph nodes Endocrine ROS: negative for - galactorrhea, hair pattern changes, polydipsia/polyuria or temperature intolerance Respiratory ROS: negative for - cough, hemoptysis, shortness of breath or wheezing Cardiovascular ROS: negative for - chest pain, dyspnea on exertion, edema or irregular heartbeat Gastrointestinal ROS: nausea/vomiting Genito-Urinary ROS: negative for - dysuria, hematuria, incontinence or urinary frequency/urgency Musculoskeletal ROS: negative for - joint swelling or muscular weakness Neurological ROS: as noted in HPI Dermatological ROS: negative for rash and skin lesion changes  Physical Examination: Blood pressure 115/70, pulse 79, temperature (!) 97.5 F (36.4 C), temperature source Oral, resp. rate 18, weight 100.8 kg, SpO2 93 %.  HEENT-  Normocephalic, no lesions, without obvious abnormality.  Normal external eye and conjunctiva.  Normal TM's bilaterally.  Normal auditory canals and external ears. Normal external nose, mucus membranes and septum.  Normal pharynx. Cardiovascular- S1, S2 normal, pulses palpable throughout   Lungs- chest clear, no  wheezing, rales, normal symmetric air entry Abdomen- soft, non-tender; bowel sounds normal; no masses,  no organomegaly Extremities- no edema Lymph-no adenopathy palpable Musculoskeletal-no joint tenderness, deformity or swelling Skin-warm and dry, no hyperpigmentation, vitiligo, or  suspicious lesions  Neurological Examination   Mental Status: Alert, oriented, thought content appropriate.  Speech fluent without evidence of aphasia.  Able to follow 3 step commands without difficulty. Cranial Nerves: II: Visual fields grossly normal, pupils equal, round, reactive to light and accommodation III,IV, VI: ptosis not present, extra-ocular motions intact bilaterally V,VII: smile symmetric, facial light touch sensation normal bilaterally VIII: hearing normal bilaterally IX,X: gag reflex present XI: bilateral shoulder shrug XII: midline tongue extension Motor: Right : Upper extremity   5/5    Left:     Upper extremity   5/5  Lower extremity   5/5     Lower extremity   5/5 Tone and bulk:normal tone throughout; no atrophy noted Sensory: Pinprick and light touch intact throughout, bilaterally Deep Tendon Reflexes: Symmetric throughout Plantars: Right: downgoing   Left: downgoing Cerebellar: Normal finger-to-nose and normal heel-to-shin testing bilaterally Gait: normal gait and station   Laboratory Studies:   Basic Metabolic Panel: Recent Labs  Lab 03/04/20 0237  NA 135  K 4.4  CL 99  CO2 28  GLUCOSE 529*  BUN 16  CREATININE 0.85  CALCIUM 9.3    Liver Function Tests: Recent Labs  Lab 03/04/20 0237  AST 26  ALT 38  ALKPHOS 150*  BILITOT 0.7  PROT 7.7  ALBUMIN 4.0   No results for input(s): LIPASE, AMYLASE in the last 168 hours. No results for input(s): AMMONIA in the last 168 hours.  CBC: Recent Labs  Lab 03/04/20 0155  WBC 11.3*  NEUTROABS 7.6  HGB 14.6  HCT 44.2  MCV 94.0  PLT 213    Cardiac Enzymes: No results for input(s): CKTOTAL, CKMB, CKMBINDEX, TROPONINI in the last 168 hours.  BNP: Invalid input(s): POCBNP  CBG: Recent Labs  Lab 03/04/20 0810 03/04/20 1017 03/04/20 1240 03/04/20 1654 03/04/20 2051  GLUCAP 420* 598* 366* 311* 229*    Microbiology: Results for orders placed or performed during the hospital encounter of  03/04/20  SARS Coronavirus 2 by RT PCR (hospital order, performed in [facility] hospital lab) Nasopharyngeal Nasopharyngeal Swab     Status: None   Collection Time: 03/04/20  4:14 AM   Specimen: Nasopharyngeal Swab  Result Value Ref Range Status   SARS Coronavirus 2 NEGATIVE NEGATIVE Final    Comment: (NOTE) SARS-CoV-2 target nucleic acids are NOT DETECTED.  The SARS-CoV-2 RNA is generally detectable in upper and lower respiratory specimens during the acute phase of infection. The lowest concentration of SARS-CoV-2 viral copies this assay can detect is 250 copies / mL. A negative result does not preclude SARS-CoV-2 infection and should not be used as the sole basis for treatment or other patient management decisions.  A negative result may occur with improper specimen collection / handling, submission of specimen other than nasopharyngeal swab, presence of viral mutation(s) within the areas targeted by this assay, and inadequate number of viral copies (<250 copies / mL). A negative result must be combined with clinical observations, patient history, and epidemiological information.  Fact Sheet for Patients:   StrictlyIdeas.no  Fact Sheet for Healthcare Providers: BankingDealers.co.za  This test is not yet approved or  cleared by the [place] FDA and has been authorized for detection and/or diagnosis of SARS-CoV-2 by FDA under an Emergency Use Authorization (EUA).  This  EUA will remain in effect (meaning this test can be used) for the duration of the COVID-19 declaration under Section 564(b)(1) of the Act, 21 U.S.C. section 360bbb-3(b)(1), unless the authorization is terminated or revoked sooner.  Performed at [facility], [address] [place], [place] [address]     Coagulation Studies: Recent Labs    03/04/20 0237  LABPROT 12.2  INR 0.9    Urinalysis:  Recent Labs  Lab 03/04/20 0156  COLORURINE  STRAW*  LABSPEC 1.032*  PHURINE 6.0  GLUCOSEU >=500*  HGBUR NEGATIVE  BILIRUBINUR NEGATIVE  KETONESUR 5*  PROTEINUR NEGATIVE  NITRITE NEGATIVE  LEUKOCYTESUR NEGATIVE    Lipid Panel:     Component Value Date/Time   CHOL 140 03/04/2020 0237   TRIG 95 03/04/2020 0237   HDL 50 03/04/2020 0237   CHOLHDL 2.8 03/04/2020 0237   VLDL 19 03/04/2020 0237   LDLCALC 71 03/04/2020 0237    HgbA1C: No results found for: HGBA1C  Urine Drug Screen:      Component Value Date/Time   LABOPIA NONE DETECTED 03/04/2020 0156   COCAINSCRNUR NONE DETECTED 03/04/2020 0156   LABBENZ NONE DETECTED 03/04/2020 0156   AMPHETMU NONE DETECTED 03/04/2020 0156   THCU NONE DETECTED 03/04/2020 0156   LABBARB NONE DETECTED 03/04/2020 0156    Alcohol Level:  Recent Labs  Lab 03/04/20 0200  ETH <10    Other results: EKG: normal sinus rhythm at 87 bpm  Imaging: CT ANGIO HEAD W OR WO CONTRAST  Result Date: 03/04/2020 CLINICAL DATA:  Vertigo EXAM: CT ANGIOGRAPHY HEAD AND NECK TECHNIQUE: Multidetector CT imaging of the head and neck was performed using the standard protocol during bolus administration of intravenous contrast. Multiplanar CT image reconstructions and MIPs were obtained to evaluate the vascular anatomy. Carotid stenosis measurements (when applicable) are obtained utilizing NASCET criteria, using the distal internal carotid diameter as the denominator. CONTRAST:  32mL OMNIPAQUE IOHEXOL 350 MG/ML SOLN COMPARISON:  None. FINDINGS: CTA NECK FINDINGS SKELETON: There is no bony spinal canal stenosis. No lytic or blastic lesion. OTHER NECK: Normal pharynx, larynx and major salivary glands. No cervical lymphadenopathy. Unremarkable thyroid gland. UPPER CHEST: Mild biapical interstitial opacity. AORTIC ARCH: There is mild calcific atherosclerosis of the aortic arch. There is no aneurysm, dissection or hemodynamically significant stenosis of the visualized portion of the aorta. Conventional 3 vessel aortic  branching pattern. The visualized proximal subclavian arteries are widely patent. RIGHT CAROTID SYSTEM: Normal without aneurysm, dissection or stenosis. LEFT CAROTID SYSTEM: Normal without aneurysm, dissection or stenosis. VERTEBRAL ARTERIES: Left dominant configuration. Both origins are clearly patent. There is no dissection, occlusion or flow-limiting stenosis to the skull base (V1-V3 segments). CTA HEAD FINDINGS POSTERIOR CIRCULATION: --Vertebral arteries: Normal V4 segments. --Inferior cerebellar arteries: Normal. --Basilar artery: Normal. --Superior cerebellar arteries: Normal. --Posterior cerebral arteries (PCA): Normal. ANTERIOR CIRCULATION: --Intracranial internal carotid arteries: Normal. --Anterior cerebral arteries (ACA): Normal. Both A1 segments are present. Patent anterior communicating artery (a-comm). --Middle cerebral arteries (MCA): Normal. VENOUS SINUSES: As permitted by contrast timing, patent. ANATOMIC VARIANTS: None Review of the MIP images confirms the above findings. IMPRESSION: 1. No emergent large vessel occlusion or high-grade stenosis of the intracranial arteries. 2. Aortic Atherosclerosis (ICD10-I70.0). Electronically Signed   By: Deatra Robinson M.D.   On: 03/04/2020 03:50   CT ANGIO NECK W OR WO CONTRAST  Result Date: 03/04/2020 CLINICAL DATA:  Vertigo EXAM: CT ANGIOGRAPHY HEAD AND NECK TECHNIQUE: Multidetector CT imaging of the head and neck was performed using the standard protocol  during bolus administration of intravenous contrast. Multiplanar CT image reconstructions and MIPs were obtained to evaluate the vascular anatomy. Carotid stenosis measurements (when applicable) are obtained utilizing NASCET criteria, using the distal internal carotid diameter as the denominator. CONTRAST:  26mL OMNIPAQUE IOHEXOL 350 MG/ML SOLN COMPARISON:  None. FINDINGS: CTA NECK FINDINGS SKELETON: There is no bony spinal canal stenosis. No lytic or blastic lesion. OTHER NECK: Normal pharynx, larynx and  major salivary glands. No cervical lymphadenopathy. Unremarkable thyroid gland. UPPER CHEST: Mild biapical interstitial opacity. AORTIC ARCH: There is mild calcific atherosclerosis of the aortic arch. There is no aneurysm, dissection or hemodynamically significant stenosis of the visualized portion of the aorta. Conventional 3 vessel aortic branching pattern. The visualized proximal subclavian arteries are widely patent. RIGHT CAROTID SYSTEM: Normal without aneurysm, dissection or stenosis. LEFT CAROTID SYSTEM: Normal without aneurysm, dissection or stenosis. VERTEBRAL ARTERIES: Left dominant configuration. Both origins are clearly patent. There is no dissection, occlusion or flow-limiting stenosis to the skull base (V1-V3 segments). CTA HEAD FINDINGS POSTERIOR CIRCULATION: --Vertebral arteries: Normal V4 segments. --Inferior cerebellar arteries: Normal. --Basilar artery: Normal. --Superior cerebellar arteries: Normal. --Posterior cerebral arteries (PCA): Normal. ANTERIOR CIRCULATION: --Intracranial internal carotid arteries: Normal. --Anterior cerebral arteries (ACA): Normal. Both A1 segments are present. Patent anterior communicating artery (a-comm). --Middle cerebral arteries (MCA): Normal. VENOUS SINUSES: As permitted by contrast timing, patent. ANATOMIC VARIANTS: None Review of the MIP images confirms the above findings. IMPRESSION: 1. No emergent large vessel occlusion or high-grade stenosis of the intracranial arteries. 2. Aortic Atherosclerosis (ICD10-I70.0). Electronically Signed   By: Deatra Robinson M.D.   On: 03/04/2020 03:50   MR BRAIN WO CONTRAST  Result Date: 03/04/2020 CLINICAL DATA:  Vertigo. EXAM: MRI HEAD WITHOUT CONTRAST TECHNIQUE: Multiplanar, multiecho pulse sequences of the brain and surrounding structures were obtained without intravenous contrast. COMPARISON:  Head CT and CTA 03/04/2020 FINDINGS: Some sequences are mildly to moderately motion degraded despite the use of faster, more motion  resistant imaging protocols. Brain: There is no evidence of acute infarct, intracranial hemorrhage, mass, midline shift, or extra-axial fluid collection. T2 hyperintensities in the cerebral white matter bilaterally are nonspecific but compatible with mild chronic small vessel ischemic disease. The ventricles and sulci are within normal limits for age. Vascular: Major intracranial vascular flow voids are preserved. Skull and upper cervical spine: Unremarkable bone marrow signal. Sinuses/Orbits: Unremarkable orbits. Paranasal sinuses and mastoid air cells are clear. Other: None. IMPRESSION: 1. No acute intracranial abnormality. 2. Mild chronic small vessel ischemic disease. Electronically Signed   By: Sebastian Ache M.D.   On: 03/04/2020 05:54   ECHOCARDIOGRAM COMPLETE  Result Date: 03/04/2020    ECHOCARDIOGRAM REPORT   Patient Name:   Dean Maldonado Perrier Date of Exam: 03/04/2020 Medical Rec #:  [id number]          Height: Accession #:    [id number]         Weight:       222.2 lb Date of Birth:  1945/10/11         BSA:          2.166 m Patient Age:    73 years           BP:           133/71 mmHg Patient Gender: M                  HR:           81 bpm. Exam Location:  [facility] Procedure: 2D Echo,  Color Doppler and Cardiac Doppler Indications:     G45.9 TIA  History:         Patient has no prior history of Echocardiogram examinations.                  Risk Factors:Hypertension.  Sonographer:     Charmayne Sheer RDCS (AE) Referring Phys:  [id number] Arvella Merles Ennis Diagnosing Phys: Serafina Royals MD  Sonographer Comments: Suboptimal parasternal window and no subcostal window. IMPRESSIONS  1. Left ventricular ejection fraction, by estimation, is 50 to 55%. The left ventricle has low normal function. The left ventricle has no regional wall motion abnormalities. Left ventricular diastolic parameters were normal.  2. Right ventricular systolic function is normal. The right ventricular size is normal.  3. The mitral valve is normal in  structure. Trivial mitral valve regurgitation.  4. The aortic valve is normal in structure. Aortic valve regurgitation is not visualized. FINDINGS  Left Ventricle: Left ventricular ejection fraction, by estimation, is 50 to 55%. The left ventricle has low normal function. The left ventricle has no regional wall motion abnormalities. The left ventricular internal cavity size was normal in size. There is no left ventricular hypertrophy. Left ventricular diastolic parameters were normal. Right Ventricle: The right ventricular size is normal. No increase in right ventricular wall thickness. Right ventricular systolic function is normal. Left Atrium: Left atrial size was normal in size. Right Atrium: Right atrial size was normal in size. Pericardium: There is no evidence of pericardial effusion. Mitral Valve: The mitral valve is normal in structure. Trivial mitral valve regurgitation. MV peak gradient, 6.8 mmHg. The mean mitral valve gradient is 2.0 mmHg. Tricuspid Valve: The tricuspid valve is normal in structure. Tricuspid valve regurgitation is trivial. Aortic Valve: The aortic valve is normal in structure. Aortic valve regurgitation is not visualized. Aortic valve mean gradient measures 2.0 mmHg. Aortic valve peak gradient measures 4.6 mmHg. Aortic valve area, by VTI measures 4.49 cm. Pulmonic Valve: The pulmonic valve was normal in structure. Pulmonic valve regurgitation is not visualized. Aorta: The aortic root and ascending aorta are structurally normal, with no evidence of dilitation. IAS/Shunts: No atrial level shunt detected by color flow Doppler.  LEFT VENTRICLE PLAX 2D LVIDd:         4.41 cm  Diastology LVIDs:         3.33 cm  LV e' lateral:   10.10 cm/s LV PW:         1.03 cm  LV E/e' lateral: 8.9 LV IVS:        1.02 cm  LV e' medial:    7.07 cm/s LVOT diam:     2.60 cm  LV E/e' medial:  12.8 LV SV:         102 LV SV Index:   47 LVOT Area:     5.31 cm  LEFT ATRIUM             Index LA diam:        3.30 cm  1.52 cm/m LA Vol (A2C):   28.5 ml 13.16 ml/m LA Vol (A4C):   26.6 ml 12.28 ml/m LA Biplane Vol: 28.0 ml 12.93 ml/m  AORTIC VALVE                   PULMONIC VALVE AV Area (Vmax):    4.44 cm    PV Vmax:       0.90 m/s AV Area (Vmean):   4.34 cm    PV Vmean:  53.800 cm/s AV Area (VTI):     4.49 cm    PV VTI:        0.181 m AV Vmax:           107.00 cm/s PV Peak grad:  3.2 mmHg AV Vmean:          71.000 cm/s PV Mean grad:  1.0 mmHg AV VTI:            0.227 m AV Peak Grad:      4.6 mmHg AV Mean Grad:      2.0 mmHg LVOT Vmax:         89.40 cm/s LVOT Vmean:        58.100 cm/s LVOT VTI:          0.192 m LVOT/AV VTI ratio: 0.85  AORTA Ao Root diam: 4.40 cm MITRAL VALVE MV Area (PHT): 6.96 cm     SHUNTS MV Peak grad:  6.8 mmHg     Systemic VTI:  0.19 m MV Mean grad:  2.0 mmHg     Systemic Diam: 2.60 cm MV Vmax:       1.30 m/s MV Vmean:      68.0 cm/s MV Decel Time: 109 msec MV E velocity: 90.20 cm/s MV A velocity: 134.00 cm/s MV E/A ratio:  0.67 Serafina Royals MD Electronically signed by Serafina Royals MD Signature Date/Time: 03/04/2020/1:04:08 PM    Final    CT HEAD CODE STROKE WO CONTRAST`  Result Date: 03/04/2020 CLINICAL DATA:  Code stroke.  Central vertigo EXAM: CT HEAD WITHOUT CONTRAST TECHNIQUE: Contiguous axial images were obtained from the base of the skull through the vertex without intravenous contrast. COMPARISON:  None. FINDINGS: Brain: There is no mass, hemorrhage or extra-axial collection. The size and configuration of the ventricles and extra-axial CSF spaces are normal. The brain parenchyma is normal, without evidence of acute or chronic infarction. Vascular: No abnormal hyperdensity of the major intracranial arteries or dural venous sinuses. No intracranial atherosclerosis. Skull: The visualized skull base, calvarium and extracranial soft tissues are normal. Sinuses/Orbits: No fluid levels or advanced mucosal thickening of the visualized paranasal sinuses. No mastoid or middle ear effusion.  The orbits are normal. ASPECTS [facility] Stroke Program Early CT Score) - Ganglionic level infarction (caudate, lentiform nuclei, internal capsule, insula, M1-M3 cortex): 7 - Supraganglionic infarction (M4-M6 cortex): 3 Total score (0-10 with 10 being normal): 10 IMPRESSION: 1. Normal head CT. 2. ASPECTS is 10. These results were called by telephone at the time of interpretation on 03/04/2020 at 2:25 am to provider [facility] , who verbally acknowledged these results. Electronically Signed   By: Ulyses Jarred M.D.   On: 03/04/2020 02:27     Assessment/Plan: 74 y.o. male with a known history of hypertension, presented to the emergency room with acute onset of vertigo with associated vomiting that started before he came to the ER.  Patient was at baseline before taking a nap. When he awakened he felt vertiginous and weak.  Was off balance with gait.  Had nausea and vomiting.  On arrival to the ED blood sugar was greater than 500.  Overnight symptoms spontaneously resolved.  Patient now at baseline.  MRI of the brain personally reviewed and shows no acute changes. CTA of the head and neck were unremarkable.  Symptoms likely a consequence of metabolic abnormalities related to hyperglycemia.    Recommendations: 1. Agree with treatment of hyperglycemia. 2. ASA '81mg'$  daily  Alexis Goodell, MD Neurology [phone] 03/04/2020, 11:15 PM

## 2020-03-04 NOTE — ED Notes (Signed)
Patient CBG= 598 mg/dl. Provider made aware. See MAR.

## 2020-03-04 NOTE — ED Notes (Signed)
Pt placed on 2L O2 [place] for desaturation to 88% while sleeping.

## 2020-03-04 NOTE — ED Notes (Signed)
Pt to MRI via stretcher accomp by MRI tech 

## 2020-03-04 NOTE — Progress Notes (Signed)
Ch visited with Pt's family in response to Code Stroke. Pt sleeping at this time after scans. Family tells Ch that they are waiting for doctor to come. Language barrier prevented further conversations. Ch explained to family about Chaplain support available anytime for support.

## 2020-03-04 NOTE — ED Notes (Signed)
Wife & daughter at bedside; Dr Don Perking in to update family

## 2020-03-04 NOTE — ED Notes (Signed)
Pt returned to room; st relief of nausea and dizziness after med received

## 2020-03-04 NOTE — Progress Notes (Addendum)
PT Screen Note  Patient Details Name: Dean Maldonado MRN: [id number] DOB: 09-12-1945   Cancelled Treatment:    Reason Eval/Treat Not Completed: PT screened, no needs identified, will sign off  Pt recently transferred from ED to the floor.  He reports feeling back to his baseline and even better than he did when OT was able to ambulate ~200 ft with him this AM.  He showed functional strength and mobility, was able to maintain balance with eyes closed and moderate perturbation, he was also able to ambulate w/o AD or safety concerns to the stairwell and easily negotiate up/down 9 steps with light single UE support, no LOBs and good overall confidence. He is eager to go home tomorrow, but does remain concerned about his blood sugar levels.  No PT needs identified for here or at discharge, will complete orders.   Malachi Pro, DPT 03/04/2020, 4:43 PM

## 2020-03-04 NOTE — ED Triage Notes (Signed)
Pt to room 3 via EMS from home; reports awoke PTA with onset dizziness, N/V

## 2020-03-04 NOTE — Consult Note (Signed)
[facility] Tele[facility]urology Consult Services   Date of Service:   03/04/2020 02:36:29  Impression:     .  R42 - Dizziness/ Vertigo/ Giddiness  Comments/Sign-Out: 74 year old male who presents with vertigo. Presentation may be due to posterior circulation stroke vs peripheral vertigo.  Metrics: Last Known Well: 03/04/2020 23:00:00 [facility] Notification Time: 03/04/2020 02:36:29 Arrival Time: 03/04/2020 01:27:00 Stamp Time: 03/04/2020 02:36:29 Time First Login Attempt: 03/04/2020 02:41:38 Symptoms: vertigo NIHSS Start Assessment Time: 03/04/2020 02:45:00 Patient is not a candidate for Thrombolytic. Thrombolytic Medical Decision: 03/04/2020 02:50:00 Patient was not deemed candidate for Thrombolytic because of following reasons: No disabling symptoms.  CT head showed no acute hemorrhage or acute core infarct.  ED Physician notified of diagnostic impression and management plan on 03/04/2020 02:55:00  Advanced Imaging: CTA Head and Neck Completed.  LVO:No   Alteplase/Activase Contraindications:  Last Known Well > 4.5 hours: No CT Head showing hemorrhage: No Ischemic stroke within 3 months: No Severe head trauma within 3 months: No Intracranial/intraspinal surgery within 3 months: No History of intracranial hemorrhage: No Symptoms and signs consistent with an SAH: No GI malignancy or GI bleed within 21 days: No Coagulopathy: Platelets <100 000/mm3, INR >1.7, aPTT>40 s, or PT >15 s: No Treatment dose of LMWH within the previous 24 hrs: No Use of NOACs in past 48 hours: No Glycoprotein IIb/IIIa receptor inhibitors use: No Symptoms consistent with infective endocarditis: No Suspected aortic arch dissection: No Intra-axial intracranial neoplasm: No  Our recommendations are outlined below.  Recommendations:     .  Activate Stroke Protocol Admission/Order Set     .  Stroke/Telemetry Floor     .  Neuro Checks     .  Bedside Swallow Eval     .   DVT Prophylaxis     .  IV Fluids, Normal Saline     .  Head of Bed 30 Degrees     .  Euglycemia and Avoid Hyperthermia (PRN Acetaminophen)     .  Antiplatelet Therapy Recommended  Routine Consultation with Inhouse Neurology for Follow up Care  Sign Out:     .  Discussed with Emergency Department Provider    ------------------------------------------------------------------------------  History of Present Illness: Patient is a 74 year old Male.  Patient was brought by EMS for symptoms of vertigo  74 year old male who presents to the hospital because of acute onset of vertigo. Patinet was normal when he went to bed at 23:00. He then woke up around 1am with vertigo, nausea, and vomiting. He denies any prior history of similar symptoms. On exam patient did not have any focal weakness, numbness, or ataxia. He did receive 1mg  of Ativan because of nausea and inability to lay flat for the CT so he was a bit drowsy on exam.           Examination: BP(164/74), Pulse(72), Blood Glucose(529) 1A: Level of Consciousness - Arouses to minor stimulation + 1 1B: Ask Month and Age - Both Questions Right + 0 1C: Blink Eyes & Squeeze Hands - Performs Both Tasks + 0 2: Test Horizontal Extraocular Movements - Normal + 0 3: Test Visual Fields - No Visual Loss + 0 4: Test Facial Palsy (Use Grimace if Obtunded) - Normal symmetry + 0 5A: Test Left Arm Motor Drift - No Drift for 10 Seconds + 0 5B: Test Right Arm Motor Drift - No Drift for 10 Seconds + 0 6A: Test Left Leg Motor Drift - No Drift for 5 Seconds +  0 6B: Test Right Leg Motor Drift - No Drift for 5 Seconds + 0 7: Test Limb Ataxia (FNF/Heel-Shin) - No Ataxia + 0 8: Test Sensation - Normal; No sensory loss + 0 9: Test Language/Aphasia - Normal; No aphasia + 0 10: Test Dysarthria - Normal + 0 11: Test Extinction/Inattention - No abnormality + 0  NIHSS Score: 1  Pre-Morbid Modified Rankin Scale: Unable to assess   Patient/Family was  informed the Neurology Consult would occur via TeleHealth consult by way of interactive audio and video telecommunications and consented to receiving care in this manner.   Patient is being evaluated for possible acute neurologic impairment and high probability of imminent or life-threatening deterioration. I spent total of 30 minutes providing care to this patient, including time for face to face visit via telemedicine, review of medical records, imaging studies and discussion of findings with providers, the patient and/or family.   Dr Joice Lofts   [facility] [phone]  Case [id number]

## 2020-03-04 NOTE — Progress Notes (Signed)
PT Cancellation Note  Patient Details Name: BARTH TRELLA MRN: [id number] DOB: 1945-09-15   Cancelled Treatment:    Reason Eval/Treat Not Completed: Medical issues which prohibited therapy Pt with elevated blood glucose this AM in the 500s, later testing late this AM reveals nearly 600.  Will hold PT at this point, hope to be able to follow up later today if numbers look appropriate.  Malachi Pro, DPT 03/04/2020, 11:39 AM

## 2020-03-04 NOTE — ED Notes (Signed)
teleneuro consult in progress; pt awakens with questioning but falls back asleep at frequent intervals

## 2020-03-04 NOTE — ED Notes (Signed)
Pt awakens easily with no c/o voiced; pt voices understanding of plan of care

## 2020-03-04 NOTE — Progress Notes (Signed)
Inpatient Diabetes Program Recommendations  AACE/ADA: New Consensus Statement on Inpatient Glycemic Control (2015)  Target Ranges:  Prepandial:   less than 140 mg/dL      Peak postprandial:   less than 180 mg/dL (1-2 hours)      Critically ill patients:  140 - 180 mg/dL   Lab Results  Component Value Date   GLUCAP 420 (H) 03/04/2020    Review of Glycemic Control Results for ARTY, LANTZY (MRN [id number]) as of 03/04/2020 09:39  Ref. Range 03/04/2020 08:10  Glucose-Capillary Latest Ref Range: 70 - 99 mg/dL 268 (H)   Diabetes history: Diet controlled DM (per chart review from 2015) Outpatient Diabetes medications:  None Current orders for Inpatient glycemic control:  Novolog sensitive tid with meals and HS  Inpatient Diabetes Program Recommendations:    Note A1C pending.  Will speak with patient regarding diabetes.  Blood sugars increased in ED and 70/30 insulin started.   -Note that patient has insurance, and may do well with one shot a day a long with oral agents (based on A1C). Will follow.   Thanks  Beryl Meager, RN, BC-ADM Inpatient Diabetes Coordinator Pager [phone] (8a-5p)

## 2020-03-05 DIAGNOSIS — E119 Type 2 diabetes mellitus without complications: Secondary | ICD-10-CM

## 2020-03-05 LAB — COMPREHENSIVE METABOLIC PANEL
ALT: 33 U/L (ref 0–44)
AST: 22 U/L (ref 15–41)
Albumin: 3.2 g/dL — ABNORMAL LOW (ref 3.5–5.0)
Alkaline Phosphatase: 73 U/L (ref 38–126)
Anion gap: 7 (ref 5–15)
BUN: 22 mg/dL (ref 8–23)
CO2: 27 mmol/L (ref 22–32)
Calcium: 8.5 mg/dL — ABNORMAL LOW (ref 8.9–10.3)
Chloride: 103 mmol/L (ref 98–111)
Creatinine, Ser: 0.75 mg/dL (ref 0.61–1.24)
GFR calc Af Amer: >60 mL/min (ref >60)
GFR calc non Af Amer: >60 mL/min (ref >60)
Glucose, Bld: 203 mg/dL — ABNORMAL HIGH (ref 70–99)
Potassium: 4.1 mmol/L (ref 3.5–5.1)
Sodium: 137 mmol/L (ref 135–145)
Total Bilirubin: 0.7 mg/dL (ref 0.3–1.2)
Total Protein: 6.1 g/dL — ABNORMAL LOW (ref 6.5–8.1)

## 2020-03-05 LAB — CBC
HCT: 41.7 % (ref 39.0–52.0)
Hemoglobin: 13.8 g/dL (ref 13.0–17.0)
MCH: 31.7 pg (ref 26.0–34.0)
MCHC: 33.1 g/dL (ref 30.0–36.0)
MCV: 95.6 fL (ref 80.0–100.0)
Platelets: 189 10*3/uL (ref 150–400)
RBC: 4.36 MIL/uL (ref 4.22–5.81)
RDW: 13.1 % (ref 11.5–15.5)
WBC: 12.8 10*3/uL — ABNORMAL HIGH (ref 4.0–10.5)
nRBC: 0 % (ref 0.0–0.2)

## 2020-03-05 LAB — LIPID PANEL
Cholesterol: 127 mg/dL (ref 0–200)
HDL: 43 mg/dL (ref >40)
LDL Cholesterol: 58 mg/dL (ref 0–99)
Total CHOL/HDL Ratio: 3 RATIO
Triglycerides: 130 mg/dL (ref <150)
VLDL: 26 mg/dL (ref 0–40)

## 2020-03-05 LAB — GLUCOSE, CAPILLARY
Glucose-Capillary: 208 mg/dL — ABNORMAL HIGH (ref 70–99)
Glucose-Capillary: 243 mg/dL — ABNORMAL HIGH (ref 70–99)

## 2020-03-05 LAB — HEMOGLOBIN A1C
Hgb A1c MFr Bld: 11.3 % — ABNORMAL HIGH (ref 4.8–5.6)
Mean Plasma Glucose: 277.61 mg/dL

## 2020-03-05 MED ORDER — LIVING WELL WITH DIABETES BOOK - IN SPANISH
Freq: Once | Status: DC
  Filled 2020-03-05 (×2): qty 1

## 2020-03-05 MED ORDER — JANUMET 50-1000 MG PO TABS
1.0000 | ORAL_TABLET | Freq: Two times a day (BID) | ORAL | 1 refills | Status: AC
Start: 2020-03-05 — End: ?

## 2020-03-05 NOTE — Care Management Obs Status (Signed)
MEDICARE OBSERVATION STATUS NOTIFICATION   Patient Details  Name: PIUS BYROM MRN: [id number] Date of Birth: April 13, 1946   Medicare Observation Status Notification Given:  Yes    Allayne Butcher, RN 03/05/2020, 12:12 PM

## 2020-03-05 NOTE — Progress Notes (Signed)
MRI neg per Dr Chipper Herb ok to discontinue stroke orders NIH and Q2 VS

## 2020-03-05 NOTE — Progress Notes (Signed)
D/C tele per Dr Amin 

## 2020-03-05 NOTE — TOC Initial Note (Signed)
Transition of Care [facility]) - Initial/Assessment Note    Patient Details  Name: Dean Maldonado MRN: [id number] Date of Birth: 31-May-1946  [facility] CM/SW Contact:    Allayne Butcher, RN Phone Number: 03/05/2020, 12:13 PM  Clinical Narrative:                 Patient placed under observation for newly diagnosed diabetes type 2.  Patient will be discharging home today.  Patient has no discharge needs.  PCP and pharmacy is [facility].  Patient lives with his wife and daughter, he is independent and drives.    Expected Discharge Plan: Home/Self Care Barriers to Discharge: No Barriers Identified   Patient Goals and CMS Choice        Expected Discharge Plan and Services Expected Discharge Plan: Home/Self Care       Living arrangements for the past 2 months: Mobile Home                                      Prior Living Arrangements/Services Living arrangements for the past 2 months: Mobile Home Lives with:: Spouse, Adult Children Patient language and need for interpreter reviewed:: Yes Do you feel safe going back to the place where you live?: Yes      Need for Family Participation in Patient Care: Yes (Comment) (new diabetic) Care giver support system in place?: Yes (comment) (wife and daughter)   Criminal Activity/Legal Involvement Pertinent to Current Situation/Hospitalization: No - Comment as needed  Activities of Daily Living Home Assistive Devices/Equipment: None ADL Screening (condition at time of admission) Patient's cognitive ability adequate to safely complete daily activities?: Yes Is the patient deaf or have difficulty hearing?: No Does the patient have difficulty seeing, even when wearing glasses/contacts?: No Does the patient have difficulty concentrating, remembering, or making decisions?: No Patient able to express need for assistance with ADLs?: Yes Does the patient have difficulty dressing or bathing?: No Independently  performs ADLs?: Yes (appropriate for developmental age) Does the patient have difficulty walking or climbing stairs?: No Weakness of Legs: None Weakness of Arms/Hands: None  Permission Sought/Granted   Permission granted to share information with : Yes, Verbal Permission Granted     Permission granted to share info w AGENCY: [facility]        Emotional Assessment Appearance:: Appears stated age Attitude/Demeanor/Rapport: Engaged Affect (typically observed): Accepting Orientation: : Oriented to Self, Oriented to Place, Oriented to  Time, Oriented to Situation Alcohol / Substance Use: Not Applicable Psych Involvement: No (comment)  Admission diagnosis:  TIA (transient ischemic attack) [G45.9] Dizziness [R42] Vertigo [R42] New onset type 2 diabetes mellitus (HCC) [E11.9] Patient Active Problem List   Diagnosis Date Noted  . Dizziness 03/04/2020   PCP:  Sandrea Hughs, NP Pharmacy:   [facility] - [place], [place] - [address] [place] [address] Phone: [phone] Fax: [phone]     Social Determinants of Health (SDOH) Interventions    Readmission Risk Interventions No flowsheet data found.

## 2020-03-05 NOTE — Discharge Summary (Signed)
Physician Discharge Summary  Brandom Kerwin Makepeace ZOX:[id number] DOB: [birth year] DOA: 03/04/2020  PCP: Sandrea Hughs, NP  Admit date: 03/04/2020 Discharge date: 03/05/2020  Admitted From: Home Disposition:  Home  Recommendations for Outpatient Follow-up:  1. Follow up with PCP in 1-2 weeks 2. Please obtain BMP/CBC in one week 3. Please follow up on the following pending results:None  Home Health:No Equipment/Devices:None Discharge Condition: Stable CODE STATUS: Full Diet recommendation: Heart Healthy / Carb Modified   Brief/Interim Summary: EduardoAscenciois a74 y.o.malewith a known history of hypertension, presented to the emergency room with acute onset of vertigo with associated vomiting that started before he came to the ER.  Patient is seen by teleneurology, MRI of the brain did not show any acute changes.  CT angiogram of the neck and the head did not show significant occlusion.  Stroke work-up remain negative.  Patient was a found to have a glucose of 529.  He never had a diagnosed with diabetes in the past.  Insulin was started with improvement in his blood glucose and symptoms.  A1c was markedly elevated at 11.4.  Patient was started on Janumet before discharge and advised to follow-up with his primary care physician for further management.  Patient will continue rest of his home meds.  Discharge Diagnoses:  Active Problems:   Vertigo   New onset type 2 diabetes mellitus [facility])  Discharge Instructions  Discharge Instructions    Diet - low sodium heart healthy   Complete by: As directed    Discharge instructions   Complete by: As directed    It was pleasure taking care of you. I am starting you on a medication called Janumet for your diabetes.  Please follow-up with your primary care physician for further management.   Increase activity slowly   Complete by: As directed      Allergies as of 03/05/2020   No Known Allergies     Medication List    TAKE these  medications   aspirin 81 MG EC tablet Take by mouth.   atorvastatin 20 MG tablet Commonly known as: LIPITOR Take 20 mg by mouth daily.   Janumet 50-1000 MG tablet Generic drug: sitaGLIPtin-metformin Take 1 tablet by mouth 2 (two) times daily with a meal.   lisinopril-hydrochlorothiazide 20-12.5 MG tablet Commonly known as: ZESTORETIC Take 1 tablet by mouth daily.   pantoprazole 40 MG tablet Commonly known as: PROTONIX Take 40 mg by mouth daily.   tamsulosin 0.4 MG Caps capsule Commonly known as: FLOMAX Take 0.4 mg by mouth daily.       Follow-up Information    Sandrea Hughs, NP. Schedule an appointment as soon as possible for a visit.   Specialty: Nurse Practitioner Contact information: [address] [place] [address] [phone]              No Known Allergies  Consultations:  Neurology  Procedures/Studies: CT ANGIO HEAD W OR WO CONTRAST  Result Date: 03/04/2020 CLINICAL DATA:  Vertigo EXAM: CT ANGIOGRAPHY HEAD AND NECK TECHNIQUE: Multidetector CT imaging of the head and neck was performed using the standard protocol during bolus administration of intravenous contrast. Multiplanar CT image reconstructions and MIPs were obtained to evaluate the vascular anatomy. Carotid stenosis measurements (when applicable) are obtained utilizing NASCET criteria, using the distal internal carotid diameter as the denominator. CONTRAST:  56mL OMNIPAQUE IOHEXOL 350 MG/ML SOLN COMPARISON:  None. FINDINGS: CTA NECK FINDINGS SKELETON: There is no bony spinal canal stenosis. No lytic or blastic lesion. OTHER NECK: Normal  pharynx, larynx and major salivary glands. No cervical lymphadenopathy. Unremarkable thyroid gland. UPPER CHEST: Mild biapical interstitial opacity. AORTIC ARCH: There is mild calcific atherosclerosis of the aortic arch. There is no aneurysm, dissection or hemodynamically significant stenosis of the visualized portion of the aorta. Conventional 3 vessel  aortic branching pattern. The visualized proximal subclavian arteries are widely patent. RIGHT CAROTID SYSTEM: Normal without aneurysm, dissection or stenosis. LEFT CAROTID SYSTEM: Normal without aneurysm, dissection or stenosis. VERTEBRAL ARTERIES: Left dominant configuration. Both origins are clearly patent. There is no dissection, occlusion or flow-limiting stenosis to the skull base (V1-V3 segments). CTA HEAD FINDINGS POSTERIOR CIRCULATION: --Vertebral arteries: Normal V4 segments. --Inferior cerebellar arteries: Normal. --Basilar artery: Normal. --Superior cerebellar arteries: Normal. --Posterior cerebral arteries (PCA): Normal. ANTERIOR CIRCULATION: --Intracranial internal carotid arteries: Normal. --Anterior cerebral arteries (ACA): Normal. Both A1 segments are present. Patent anterior communicating artery (a-comm). --Middle cerebral arteries (MCA): Normal. VENOUS SINUSES: As permitted by contrast timing, patent. ANATOMIC VARIANTS: None Review of the MIP images confirms the above findings. IMPRESSION: 1. No emergent large vessel occlusion or high-grade stenosis of the intracranial arteries. 2. Aortic Atherosclerosis (ICD10-I70.0). Electronically Signed   By: Deatra Robinson M.D.   On: 03/04/2020 03:50   CT ANGIO NECK W OR WO CONTRAST  Result Date: 03/04/2020 CLINICAL DATA:  Vertigo EXAM: CT ANGIOGRAPHY HEAD AND NECK TECHNIQUE: Multidetector CT imaging of the head and neck was performed using the standard protocol during bolus administration of intravenous contrast. Multiplanar CT image reconstructions and MIPs were obtained to evaluate the vascular anatomy. Carotid stenosis measurements (when applicable) are obtained utilizing NASCET criteria, using the distal internal carotid diameter as the denominator. CONTRAST:  75mL OMNIPAQUE IOHEXOL 350 MG/ML SOLN COMPARISON:  None. FINDINGS: CTA NECK FINDINGS SKELETON: There is no bony spinal canal stenosis. No lytic or blastic lesion. OTHER NECK: Normal pharynx,  larynx and major salivary glands. No cervical lymphadenopathy. Unremarkable thyroid gland. UPPER CHEST: Mild biapical interstitial opacity. AORTIC ARCH: There is mild calcific atherosclerosis of the aortic arch. There is no aneurysm, dissection or hemodynamically significant stenosis of the visualized portion of the aorta. Conventional 3 vessel aortic branching pattern. The visualized proximal subclavian arteries are widely patent. RIGHT CAROTID SYSTEM: Normal without aneurysm, dissection or stenosis. LEFT CAROTID SYSTEM: Normal without aneurysm, dissection or stenosis. VERTEBRAL ARTERIES: Left dominant configuration. Both origins are clearly patent. There is no dissection, occlusion or flow-limiting stenosis to the skull base (V1-V3 segments). CTA HEAD FINDINGS POSTERIOR CIRCULATION: --Vertebral arteries: Normal V4 segments. --Inferior cerebellar arteries: Normal. --Basilar artery: Normal. --Superior cerebellar arteries: Normal. --Posterior cerebral arteries (PCA): Normal. ANTERIOR CIRCULATION: --Intracranial internal carotid arteries: Normal. --Anterior cerebral arteries (ACA): Normal. Both A1 segments are present. Patent anterior communicating artery (a-comm). --Middle cerebral arteries (MCA): Normal. VENOUS SINUSES: As permitted by contrast timing, patent. ANATOMIC VARIANTS: None Review of the MIP images confirms the above findings. IMPRESSION: 1. No emergent large vessel occlusion or high-grade stenosis of the intracranial arteries. 2. Aortic Atherosclerosis (ICD10-I70.0). Electronically Signed   By: Deatra Robinson M.D.   On: 03/04/2020 03:50   MR BRAIN WO CONTRAST  Result Date: 03/04/2020 CLINICAL DATA:  Vertigo. EXAM: MRI HEAD WITHOUT CONTRAST TECHNIQUE: Multiplanar, multiecho pulse sequences of the brain and surrounding structures were obtained without intravenous contrast. COMPARISON:  Head CT and CTA 03/04/2020 FINDINGS: Some sequences are mildly to moderately motion degraded despite the use of faster,  more motion resistant imaging protocols. Brain: There is no evidence of acute infarct, intracranial hemorrhage, mass, midline shift, or extra-axial fluid collection.  T2 hyperintensities in the cerebral white matter bilaterally are nonspecific but compatible with mild chronic small vessel ischemic disease. The ventricles and sulci are within normal limits for age. Vascular: Major intracranial vascular flow voids are preserved. Skull and upper cervical spine: Unremarkable bone marrow signal. Sinuses/Orbits: Unremarkable orbits. Paranasal sinuses and mastoid air cells are clear. Other: None. IMPRESSION: 1. No acute intracranial abnormality. 2. Mild chronic small vessel ischemic disease. Electronically Signed   By: Sebastian Ache M.D.   On: 03/04/2020 05:54   ECHOCARDIOGRAM COMPLETE  Result Date: 03/04/2020    ECHOCARDIOGRAM REPORT   Patient Name:   KEIMARI [place] Tango Date of Exam: 03/04/2020 Medical Rec #:  [id number]          Height: Accession #:    [id number]         Weight:       222.2 lb Date of Birth:  [birth year]         BSA:          2.166 m Patient Age:    73 years           BP:           133/71 mmHg Patient Gender: M                  HR:           81 bpm. Exam Location:  [facility] Procedure: 2D Echo, Color Doppler and Cardiac Doppler Indications:     G45.9 TIA  History:         Patient has no prior history of Echocardiogram examinations.                  Risk Factors:Hypertension.  Sonographer:     Humphrey Rolls RDCS (AE) Referring Phys:  [id number] Vernetta Honey MANSY Diagnosing Phys: Arnoldo Hooker MD  Sonographer Comments: Suboptimal parasternal window and no subcostal window. IMPRESSIONS  1. Left ventricular ejection fraction, by estimation, is 50 to 55%. The left ventricle has low normal function. The left ventricle has no regional wall motion abnormalities. Left ventricular diastolic parameters were normal.  2. Right ventricular systolic function is normal. The right ventricular size is normal.  3. The mitral valve is  normal in structure. Trivial mitral valve regurgitation.  4. The aortic valve is normal in structure. Aortic valve regurgitation is not visualized. FINDINGS  Left Ventricle: Left ventricular ejection fraction, by estimation, is 50 to 55%. The left ventricle has low normal function. The left ventricle has no regional wall motion abnormalities. The left ventricular internal cavity size was normal in size. There is no left ventricular hypertrophy. Left ventricular diastolic parameters were normal. Right Ventricle: The right ventricular size is normal. No increase in right ventricular wall thickness. Right ventricular systolic function is normal. Left Atrium: Left atrial size was normal in size. Right Atrium: Right atrial size was normal in size. Pericardium: There is no evidence of pericardial effusion. Mitral Valve: The mitral valve is normal in structure. Trivial mitral valve regurgitation. MV peak gradient, 6.8 mmHg. The mean mitral valve gradient is 2.0 mmHg. Tricuspid Valve: The tricuspid valve is normal in structure. Tricuspid valve regurgitation is trivial. Aortic Valve: The aortic valve is normal in structure. Aortic valve regurgitation is not visualized. Aortic valve mean gradient measures 2.0 mmHg. Aortic valve peak gradient measures 4.6 mmHg. Aortic valve area, by VTI measures 4.49 cm. Pulmonic Valve: The pulmonic valve was normal in structure. Pulmonic valve regurgitation is not visualized. Aorta: The aortic root and  ascending aorta are structurally normal, with no evidence of dilitation. IAS/Shunts: No atrial level shunt detected by color flow Doppler.  LEFT VENTRICLE PLAX 2D LVIDd:         4.41 cm  Diastology LVIDs:         3.33 cm  LV e' lateral:   10.10 cm/s LV PW:         1.03 cm  LV E/e' lateral: 8.9 LV IVS:        1.02 cm  LV e' medial:    7.07 cm/s LVOT diam:     2.60 cm  LV E/e' medial:  12.8 LV SV:         102 LV SV Index:   47 LVOT Area:     5.31 cm  LEFT ATRIUM             Index LA diam:         3.30 cm 1.52 cm/m LA Vol (A2C):   28.5 ml 13.16 ml/m LA Vol (A4C):   26.6 ml 12.28 ml/m LA Biplane Vol: 28.0 ml 12.93 ml/m  AORTIC VALVE                   PULMONIC VALVE AV Area (Vmax):    4.44 cm    PV Vmax:       0.90 m/s AV Area (Vmean):   4.34 cm    PV Vmean:      53.800 cm/s AV Area (VTI):     4.49 cm    PV VTI:        0.181 m AV Vmax:           107.00 cm/s PV Peak grad:  3.2 mmHg AV Vmean:          71.000 cm/s PV Mean grad:  1.0 mmHg AV VTI:            0.227 m AV Peak Grad:      4.6 mmHg AV Mean Grad:      2.0 mmHg LVOT Vmax:         89.40 cm/s LVOT Vmean:        58.100 cm/s LVOT VTI:          0.192 m LVOT/AV VTI ratio: 0.85  AORTA Ao Root diam: 4.40 cm MITRAL VALVE MV Area (PHT): 6.96 cm     SHUNTS MV Peak grad:  6.8 mmHg     Systemic VTI:  0.19 m MV Mean grad:  2.0 mmHg     Systemic Diam: 2.60 cm MV Vmax:       1.30 m/s MV Vmean:      68.0 cm/s MV Decel Time: 109 msec MV E velocity: 90.20 cm/s MV A velocity: 134.00 cm/s MV E/A ratio:  0.67 Arnoldo Hooker MD Electronically signed by Arnoldo Hooker MD Signature Date/Time: 03/04/2020/1:04:08 PM    Final    CT HEAD CODE STROKE WO CONTRAST`  Result Date: 03/04/2020 CLINICAL DATA:  Code stroke.  Central vertigo EXAM: CT HEAD WITHOUT CONTRAST TECHNIQUE: Contiguous axial images were obtained from the base of the skull through the vertex without intravenous contrast. COMPARISON:  None. FINDINGS: Brain: There is no mass, hemorrhage or extra-axial collection. The size and configuration of the ventricles and extra-axial CSF spaces are normal. The brain parenchyma is normal, without evidence of acute or chronic infarction. Vascular: No abnormal hyperdensity of the major intracranial arteries or dural venous sinuses. No intracranial atherosclerosis. Skull: The visualized skull base, calvarium and extracranial soft tissues  are normal. Sinuses/Orbits: No fluid levels or advanced mucosal thickening of the visualized paranasal sinuses. No mastoid or middle ear  effusion. The orbits are normal. ASPECTS [facility]([place] Stroke Program Early CT Score) - Ganglionic level infarction (caudate, lentiform nuclei, internal capsule, insula, M1-M3 cortex): 7 - Supraganglionic infarction (M4-M6 cortex): 3 Total score (0-10 with 10 being normal): 10 IMPRESSION: 1. Normal head CT. 2. ASPECTS is 10. These results were called by telephone at the time of interpretation on 03/04/2020 at 2:25 am to provider [facility] , who verbally acknowledged these results. Electronically Signed   By: Deatra RobinsonKevin  Herman M.D.   On: 03/04/2020 02:27     Subjective: Patient has no new complaint today.  He does not has any prior diagnosis of diabetes but willing to start the treatment and will follow up with his primary care provider.  Discharge Exam: Vitals:   03/05/20 0815 03/05/20 1141  BP: (!) 148/73 119/74  Pulse: 83 70  Resp: 20 16  Temp: 98.6 F (37 C) 98 F (36.7 C)  SpO2: 93% 94%   Vitals:   03/05/20 0024 03/05/20 0509 03/05/20 0815 03/05/20 1141  BP: (!) 113/58 (!) 106/59 (!) 148/73 119/74  Pulse: 72 77 83 70  Resp: 18 (!) 22 20 16   Temp: 97.9 F (36.6 C) 98.3 F (36.8 C) 98.6 F (37 C) 98 F (36.7 C)  TempSrc: Oral Oral Oral Oral  SpO2: 94% 92% 93% 94%  Weight:        General: Pt is alert, awake, not in acute distress Cardiovascular: RRR, S1/S2 +, no rubs, no gallops Respiratory: CTA bilaterally, no wheezing, no rhonchi Abdominal: Soft, NT, ND, bowel sounds + Extremities: no edema, no cyanosis   The results of significant diagnostics from this hospitalization (including imaging, microbiology, ancillary and laboratory) are listed below for reference.    Microbiology: Recent Results (from the past 240 hour(s))  SARS Coronavirus 2 by RT PCR (hospital order[facility]) Nasopharyngeal Nasopharyngeal Swab     Status: None   Collection Time: 03/04/20  4:14 AM   Specimen: Nasopharyngeal Swab  Result Value Ref Range Status   SARS  Coronavirus 2 NEGATIVE NEGATIVE Final    Comment: (NOTE) SARS-CoV-2 target nucleic acids are NOT DETECTED.  The SARS-CoV-2 RNA is generally detectable in upper and lower respiratory specimens during the acute phase of infection. The lowest concentration of SARS-CoV-2 viral copies this assay can detect is 250 copies / mL. A negative result does not preclude SARS-CoV-2 infection and should not be used as the sole basis for treatment or other patient management decisions.  A negative result may occur with improper specimen collection / handling, submission of specimen other than nasopharyngeal swab, presence of viral mutation(s) within the areas targeted by this assay, and inadequate number of viral copies (<250 copies / mL). A negative result must be combined with clinical observations, patient history, and epidemiological information.  Fact Sheet for Patients:   BoilerBrush.com.cyhttps://www.fda.gov/media/136312/download  Fact Sheet for Healthcare Providers: https://pope.com/https://www.fda.gov/media/136313/download  This test is not yet approved or  cleared by the [place] FDA and has been authorized for detection and/or diagnosis of SARS-CoV-2 by FDA under an Emergency Use Authorization (EUA).  This EUA will remain in effect (meaning this test can be used) for the duration of the COVID-19 declaration under Section 564(b)(1) of the Act, 21 U.S.C. section 360bbb-3(b)(1), unless the authorization is terminated or revoked sooner.  Performed at [facility], [address] [place], [place] [id number]  Labs: BNP (last 3 results) No results for input(s): BNP in the last 8760 hours. Basic Metabolic Panel: Recent Labs  Lab 03/04/20 0237 03/05/20 0520  NA 135 137  K 4.4 4.1  CL 99 103  CO2 28 27  GLUCOSE 529* 203*  BUN 16 22  CREATININE 0.85 0.75  CALCIUM 9.3 8.5*   Liver Function Tests: Recent Labs  Lab 03/04/20 0237 03/05/20 0520  AST 26 22  ALT 38 33  ALKPHOS 150* 73  BILITOT  0.7 0.7  PROT 7.7 6.1*  ALBUMIN 4.0 3.2*   No results for input(s): LIPASE, AMYLASE in the last 168 hours. No results for input(s): AMMONIA in the last 168 hours. CBC: Recent Labs  Lab 03/04/20 0155 03/05/20 0520  WBC 11.3* 12.8*  NEUTROABS 7.6  --   HGB 14.6 13.8  HCT 44.2 41.7  MCV 94.0 95.6  PLT 213 189   Cardiac Enzymes: No results for input(s): CKTOTAL, CKMB, CKMBINDEX, TROPONINI in the last 168 hours. BNP: Invalid input(s): POCBNP CBG: Recent Labs  Lab 03/04/20 1240 03/04/20 1654 03/04/20 2051 03/05/20 0802 03/05/20 1144  GLUCAP 366* 311* 229* 208* 243*   D-Dimer No results for input(s): DDIMER in the last 72 hours. Hgb A1c Recent Labs    03/04/20 0726 03/05/20 0520  HGBA1C 11.4* 11.3*   Lipid Profile Recent Labs    03/04/20 0237 03/05/20 0520  CHOL 140 127  HDL 50 43  LDLCALC 71 58  TRIG 95 130  CHOLHDL 2.8 3.0   Thyroid function studies No results for input(s): TSH, T4TOTAL, T3FREE, THYROIDAB in the last 72 hours.  Invalid input(s): FREET3 Anemia work up No results for input(s): VITAMINB12, FOLATE, FERRITIN, TIBC, IRON, RETICCTPCT in the last 72 hours. Urinalysis    Component Value Date/Time   COLORURINE STRAW (A) 03/04/2020 0156   APPEARANCEUR CLEAR (A) 03/04/2020 0156   APPEARANCEUR Clear 11/16/2013 2133   LABSPEC 1.032 (H) 03/04/2020 0156   LABSPEC 1.006 11/16/2013 2133   PHURINE 6.0 03/04/2020 0156   GLUCOSEU >=500 (A) 03/04/2020 0156   GLUCOSEU Negative 11/16/2013 2133   HGBUR NEGATIVE 03/04/2020 0156   BILIRUBINUR NEGATIVE 03/04/2020 0156   BILIRUBINUR Negative 11/16/2013 2133   KETONESUR 5 (A) 03/04/2020 0156   PROTEINUR NEGATIVE 03/04/2020 0156   NITRITE NEGATIVE 03/04/2020 0156   LEUKOCYTESUR NEGATIVE 03/04/2020 0156   LEUKOCYTESUR Negative 11/16/2013 2133   Sepsis Labs Invalid input(s): PROCALCITONIN,  WBC,  LACTICIDVEN Microbiology Recent Results (from the past 240 hour(s))  SARS Coronavirus 2 by RT PCR (hospital  order, performed in [facility] hospital lab) Nasopharyngeal Nasopharyngeal Swab     Status: None   Collection Time: 03/04/20  4:14 AM   Specimen: Nasopharyngeal Swab  Result Value Ref Range Status   SARS Coronavirus 2 NEGATIVE NEGATIVE Final    Comment: (NOTE) SARS-CoV-2 target nucleic acids are NOT DETECTED.  The SARS-CoV-2 RNA is generally detectable in upper and lower respiratory specimens during the acute phase of infection. The lowest concentration of SARS-CoV-2 viral copies this assay can detect is 250 copies / mL. A negative result does not preclude SARS-CoV-2 infection and should not be used as the sole basis for treatment or other patient management decisions.  A negative result may occur with improper specimen collection / handling, submission of specimen other than nasopharyngeal swab, presence of viral mutation(s) within the areas targeted by this assay, and inadequate number of viral copies (<250 copies / mL). A negative result must be combined with clinical observations, patient history, and  epidemiological information.  Fact Sheet for Patients:   BoilerBrush.com.cy  Fact Sheet for Healthcare Providers: https://pope.com/  This test is not yet approved or  cleared by the [place] FDA and has been authorized for detection and/or diagnosis of SARS-CoV-2 by FDA under an Emergency Use Authorization (EUA).  This EUA will remain in effect (meaning this test can be used) for the duration of the COVID-19 declaration under Section 564(b)(1) of the Act, 21 U.S.C. section 360bbb-3(b)(1), unless the authorization is terminated or revoked sooner.  Performed at [facility], [address] [place], [place] [address]     Time coordinating discharge: Over 30 minutes  SIGNED:  Arnetha Courser, MD  [facility] 03/05/2020, 12:57 PM  If 7PM-7AM, please contact night-coverage [email or web address]  This record has  been created using Conservation officer, historic buildings. Errors have been sought and corrected,but may not always be located. Such creation errors do not reflect on the standard of care.

## 2020-03-05 NOTE — Progress Notes (Signed)
Inpatient Diabetes Program Recommendations  AACE/ADA: New Consensus Statement on Inpatient Glycemic Control   Target Ranges:  Prepandial:   less than 140 mg/dL      Peak postprandial:   less than 180 mg/dL (1-2 hours)      Critically ill patients:  140 - 180 mg/dL   Results for MCKALE, HAFFEY (MRN [id number]) as of 03/05/2020 13:14  Ref. Range 03/04/2020 08:10 03/04/2020 10:17 03/04/2020 12:40 03/04/2020 16:54 03/04/2020 20:51 03/05/2020 08:02 03/05/2020 11:44  Glucose-Capillary Latest Ref Range: 70 - 99 mg/dL 440 (H) 102 (HH) 725 (H) 311 (H) 229 (H) 208 (H) 243 (H)  Results for YOJAN, PASKETT (MRN [id number]) as of 03/05/2020 13:14  Ref. Range 03/04/2020 07:26 03/05/2020 05:20  Hemoglobin A1C Latest Ref Range: 4.8 - 5.6 % 11.4 (H) 11.3 (H)   Review of Glycemic Control  Diabetes history: NO Outpatient Diabetes medications: NA Current orders for Inpatient glycemic control: 70/30 28 units BID, 0-9 units AC&HS  NOTE: Noted consult for diabetes coordinator. Diabetes coordinator working remotely today. Called patient over the phone. Patient is able to speak [place] fluently and states he fully understands [place]. Patient can read in [place] and [place] and state he would prefer for written material to be in [place].  Patient states that he has no prior DM hx that he is aware of. Patient states that he last seen his PCP in person about 1 year ago and he notes that he last had blood work done about 6 months ago and was told his glucose was on the borderline. Patient states that his wife has DM and she is knowledgeable about DM and will be able to help him with DM management. Discussed initial glucose values and A1C results (11.3% on 03/05/20) and explained what an A1C is and informed patient that his current A1C indicates an average glucose of 278 mg/dl over the past 2-3 months. Discussed basic pathophysiology of DM Type 2, basic home care, importance of checking CBGs and maintaining good CBG control to  prevent long-term and short-term complications. Reviewed glucose and A1C goals.  Reviewed signs and symptoms of hyperglycemia and hypoglycemia along with treatment for both. Patient states that he has been having symptoms of hyperglycemia for a while. Discussed impact of nutrition, exercise, stress, sickness, and medications on diabetes control. Discussed carb modified diet and patient reports that his wife follows a carb modified diet and he will plan to make changes with his diet.  Patient states that he has not received a Living Well with diabetes booklet yet. Informed patient that another book would be ordered in Spanish and RN would be asked to be sure he gets the book before discharge today.  Encouraged patient to read through entire book to enhance knowledge about DM management. Asked patient to check his glucose as MD directs on discharge paperwork and to keep a log book of glucose readings.  Explained how his doctor can use the log book to continue to make adjustments with DM medication if needed. Patient states that the doctor today has told him he will be discharged on oral DM medications. Asked patient to be sure he follows up with PCP within the next week and to reach out to his PCP if his glucose continues to be consistently over 180-200 mg/dl as he may need additional DM medication adjustments.   Patient verbalized understanding of information discussed and he states that he has no further questions at this time related to diabetes.   RNs to provide ongoing  basic DM education at bedside with this patient while inpatient.  Ordered Spanish Living Well with DM book with comment for pharmacy to send ASAP since patient has order to be discharged.  Thanks, Orlando Penner, RN, MSN, CDE Diabetes Coordinator Inpatient Diabetes Program [phone] (Team Pager from 8am to 5pm)

## 2021-10-07 ENCOUNTER — Ambulatory Visit
Admission: RE | Admit: 2021-10-07 | Discharge: 2021-10-07 | Disposition: A | Discharge status: Home / Self Care | Payer: Medicare Other | Source: Ambulatory Visit | Attending: Physician Assistant | Admitting: Physician Assistant

## 2021-10-07 ENCOUNTER — Other Ambulatory Visit: Payer: Self-pay

## 2021-10-07 ENCOUNTER — Other Ambulatory Visit: Payer: Self-pay | Admitting: Physician Assistant

## 2021-10-07 DIAGNOSIS — M7989 Other specified soft tissue disorders: Secondary | ICD-10-CM | POA: Insufficient documentation

## 2021-10-09 IMAGING — CT CT ANGIO NECK
2 of 8 series · 8 of 33 positions shown · IV contrast (APPLIED)
Comparison: None.

CLINICAL DATA: Vertigo

EXAM:
CT ANGIOGRAPHY HEAD AND NECK
TECHNIQUE: Multidetector CT imaging of the head and neck was performed using
the standard protocol during bolus administration of intravenous
contrast. Multiplanar CT image reconstructions and MIPs were
obtained to evaluate the vascular anatomy. Carotid stenosis
measurements (when applicable) are obtained utilizing NASCET
criteria, using the distal internal carotid diameter as the
denominator.
CONTRAST:  75mL OMNIPAQUE IOHEXOL 350 MG/ML SOLN

[Series 6: cta head neck · axial · 0.60mm/px · z∈[-173,-61]mm · 2 of 170 slices shown]
[im 57/170  soft-tissue]
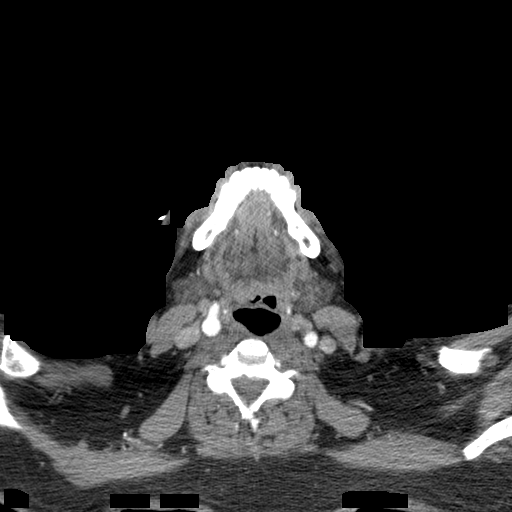
[im 113/170  soft-tissue]
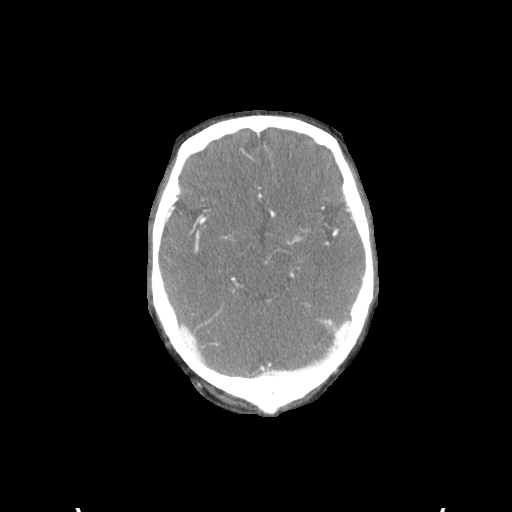

[Series 8: ax thin · axial · 0.49mm/px · z∈[-281,-57]mm · 6 of 335 slices shown]
[im 48/335  soft-tissue]
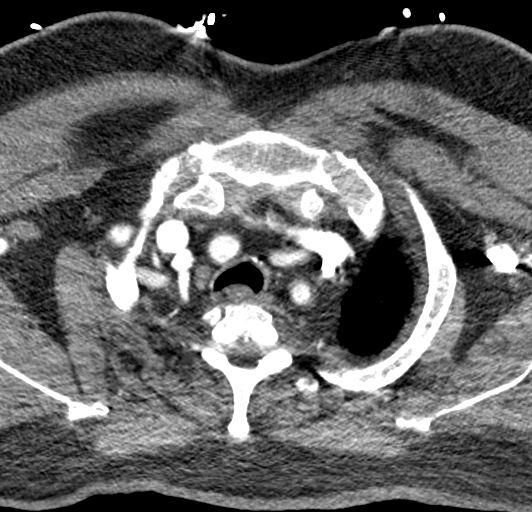
[im 96/335  bone]
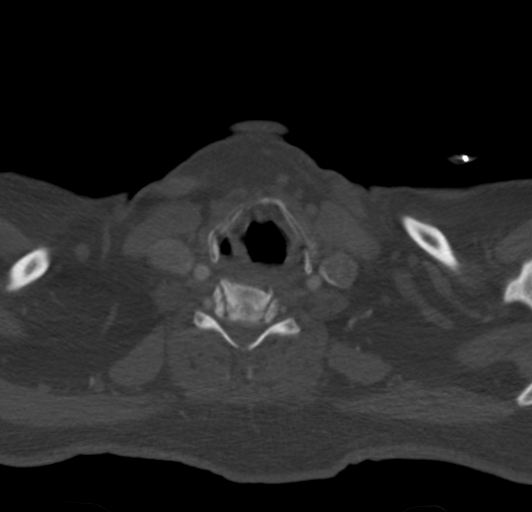
[im 144/335  soft-tissue]
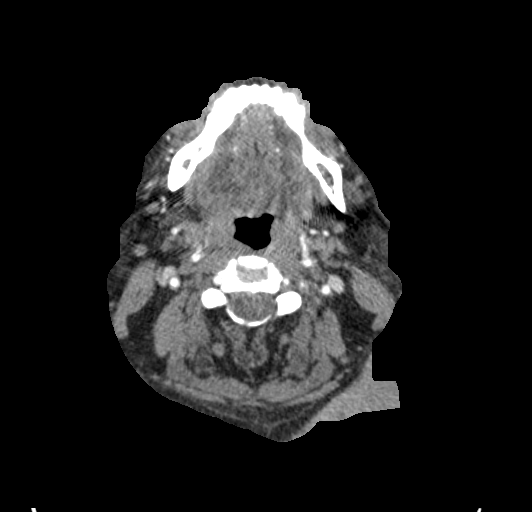
[im 191/335  bone]
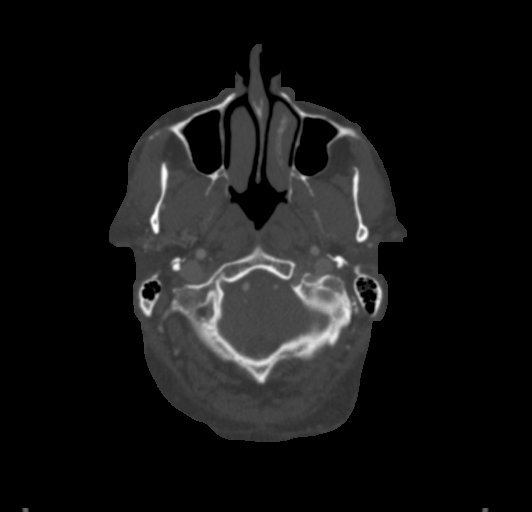
[im 239/335  soft-tissue]
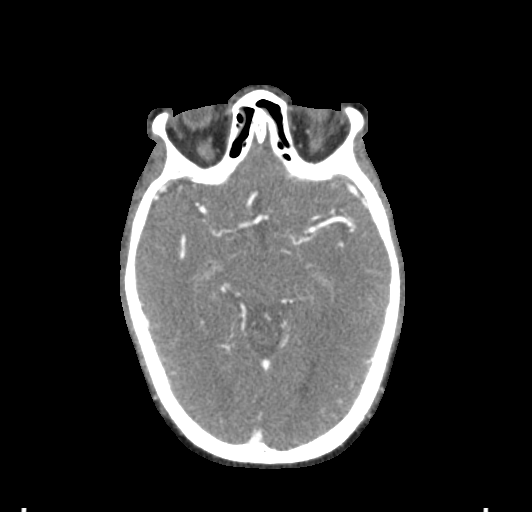
[im 287/335  bone]
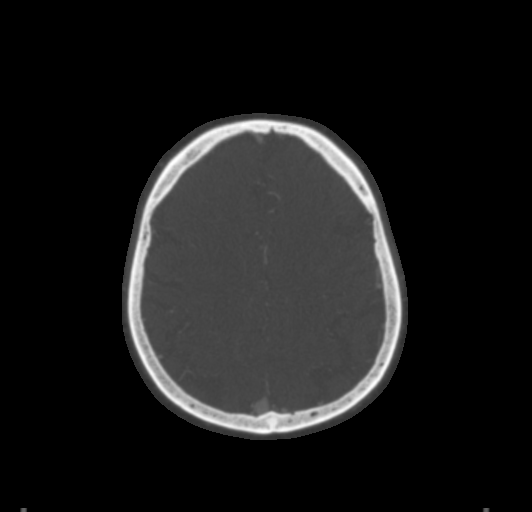

[8 of 33 positions shown; findings below may reference images not displayed]

FINDINGS: CTA NECK FINDINGS

SKELETON: There is no bony spinal canal stenosis. No lytic or
blastic lesion.

OTHER NECK: Normal pharynx, larynx and major salivary glands. No
cervical lymphadenopathy. Unremarkable thyroid gland.

UPPER CHEST: Mild biapical interstitial opacity.

AORTIC ARCH:

There is mild calcific atherosclerosis of the aortic arch. There is
no aneurysm, dissection or hemodynamically significant stenosis of
the visualized portion of the aorta. Conventional 3 vessel aortic
branching pattern. The visualized proximal subclavian arteries are
widely patent.

RIGHT CAROTID SYSTEM: Normal without aneurysm, dissection or
stenosis.

LEFT CAROTID SYSTEM: Normal without aneurysm, dissection or
stenosis.

VERTEBRAL ARTERIES: Left dominant configuration. Both origins are
clearly patent. There is no dissection, occlusion or flow-limiting
stenosis to the skull base (V1-V3 segments).

CTA HEAD FINDINGS

POSTERIOR CIRCULATION:

--Vertebral arteries: Normal V4 segments.

--Inferior cerebellar arteries: Normal.

--Basilar artery: Normal.

--Superior cerebellar arteries: Normal.

--Posterior cerebral arteries (PCA): Normal.

ANTERIOR CIRCULATION:

--Intracranial internal carotid arteries: Normal.

--Anterior cerebral arteries (ACA): Normal. Both A1 segments are
present. Patent anterior communicating artery (a-comm).

--Middle cerebral arteries (MCA): Normal.

VENOUS SINUSES: As permitted by contrast timing, patent.

ANATOMIC VARIANTS: None

Review of the MIP images confirms the above findings.
IMPRESSION: 1. No emergent large vessel occlusion or high-grade stenosis of the
intracranial arteries.
2. Aortic Atherosclerosis ([id number]).

## 2023-02-23 ENCOUNTER — Encounter: Payer: Self-pay | Admitting: Ophthalmology

## 2023-02-24 ENCOUNTER — Encounter: Payer: Self-pay | Admitting: Ophthalmology

## 2023-02-24 NOTE — Anesthesia Preprocedure Evaluation (Addendum)
Anesthesia Evaluation  Patient identified by MRN, date of birth, ID band Patient awake    Reviewed: Allergy & Precautions, NPO status , Patient's Chart, lab work & pertinent test results  History of Anesthesia Complications Negative for: history of anesthetic complications  Airway Mallampati: II  TM Distance: >3 FB Neck ROM: Full    Dental  (+) Partial Upper   Pulmonary neg sleep apnea, neg COPD, Patient abstained from smoking.Not current smoker Chronic cough, associates it with "weather changes".No hx of pulmonary diagnoses   Pulmonary exam normal breath sounds clear to auscultation       Cardiovascular Exercise Tolerance: Good METShypertension, Pt. on medications (-) CAD and (-) Past MI (-) dysrhythmias  Rhythm:Regular Rate:Normal - Systolic murmurs 78-29-56 1. Left ventricular ejection fraction, by estimation, is 50 to 55%. The  left ventricle has low normal function. The left ventricle has no regional  wall motion abnormalities. Left ventricular diastolic parameters were  normal.   2. Right ventricular systolic function is normal. The right ventricular  size is normal.   3. The mitral valve is normal in structure. Trivial mitral valve  regurgitation.   4. The aortic valve is normal in structure. Aortic valve regurgitation is  not visualized.     Neuro/Psych 03-04-20 CT  Mild chronic small vessel ischemic disease TIA negative psych ROS   GI/Hepatic ,GERD  Medicated and Controlled,,(+)     (-) substance abuse    Endo/Other  neg diabetes    Renal/GU negative Renal ROS     Musculoskeletal   Abdominal   Peds  Hematology   Anesthesia Other Findings Past Medical History: No date: GERD (gastroesophageal reflux disease) No date: Hypertension No date: Interstitial lung disease (HCC) No date: TIA (transient ischemic attack) No date: Vertigo  Reproductive/Obstetrics                              Anesthesia Physical Anesthesia Plan  ASA: 3  Anesthesia Plan: MAC   Post-op Pain Management:    Induction: Intravenous  PONV Risk Score and Plan: 1 and Midazolam  Airway Management Planned: Nasal Cannula  Additional Equipment:   Intra-op Plan:   Post-operative Plan:   Informed Consent: I have reviewed the patients History and Physical, chart, labs and discussed the procedure including the risks, benefits and alternatives for the proposed anesthesia with the patient or authorized representative who has indicated his/her understanding and acceptance.       Plan Discussed with: CRNA and Surgeon  Anesthesia Plan Comments: (Explained risks of anesthesia, including PONV, and rare emergencies such as cardiac events, respiratory problems, and allergic reactions, requiring invasive intervention. Discussed the role of CRNA in patient's perioperative care. Patient understands. )        Anesthesia Quick Evaluation

## 2023-04-19 NOTE — Discharge Instructions (Signed)

## 2023-04-21 ENCOUNTER — Ambulatory Visit: Payer: Medicare Other | Admitting: Anesthesiology

## 2023-04-21 ENCOUNTER — Encounter: Payer: Self-pay | Admitting: Ophthalmology

## 2023-04-21 ENCOUNTER — Other Ambulatory Visit: Payer: Self-pay

## 2023-04-21 ENCOUNTER — Encounter
Admission: RE | Disposition: A | Discharge status: Home / Self Care | Payer: Self-pay | Source: Home / Self Care | Attending: Ophthalmology

## 2023-04-21 ENCOUNTER — Ambulatory Visit
Admission: RE | Admit: 2023-04-21 | Discharge: 2023-04-21 | Disposition: A | Discharge status: Home / Self Care | Payer: Medicare Other | Attending: Ophthalmology | Admitting: Ophthalmology

## 2023-04-21 DIAGNOSIS — H2512 Age-related nuclear cataract, left eye: Secondary | ICD-10-CM | POA: Diagnosis present

## 2023-04-21 HISTORY — DX: Dizziness and giddiness: R42

## 2023-04-21 HISTORY — DX: Gastro-esophageal reflux disease without esophagitis: K21.9

## 2023-04-21 HISTORY — PX: CATARACT EXTRACTION W/PHACO: SHX586

## 2023-04-21 HISTORY — DX: Transient cerebral ischemic attack, unspecified: G45.9

## 2023-04-21 HISTORY — DX: Interstitial pulmonary disease, unspecified: J84.9

## 2023-04-21 LAB — GLUCOSE, CAPILLARY: Glucose-Capillary: 110 mg/dL — ABNORMAL HIGH (ref 70–99)

## 2023-04-21 SURGERY — PHACOEMULSIFICATION, CATARACT, WITH IOL INSERTION
Anesthesia: Monitor Anesthesia Care | Site: Eye | Laterality: Left

## 2023-04-21 MED ORDER — LIDOCAINE HCL (PF) 2 % IJ SOLN
INTRAOCULAR | Status: DC | PRN
  Administered 2023-04-21: 1 mL via INTRAOCULAR

## 2023-04-21 MED ORDER — BRIMONIDINE TARTRATE-TIMOLOL 0.2-0.5 % OP SOLN
OPHTHALMIC | Status: DC | PRN
  Administered 2023-04-21: 1 [drp] via OPHTHALMIC

## 2023-04-21 MED ORDER — SIGHTPATH DOSE#1 BSS IO SOLN
INTRAOCULAR | Status: DC | PRN
  Administered 2023-04-21: 15 mL

## 2023-04-21 MED ORDER — MIDAZOLAM HCL 2 MG/2ML IJ SOLN
INTRAMUSCULAR | Status: DC | PRN
  Administered 2023-04-21: 1 mg via INTRAVENOUS

## 2023-04-21 MED ORDER — TETRACAINE HCL 0.5 % OP SOLN
1.0000 [drp] | OPHTHALMIC | Status: DC | PRN
  Administered 2023-04-21 (×3): 1 [drp] via OPHTHALMIC

## 2023-04-21 MED ORDER — SODIUM CHLORIDE 0.9% FLUSH
INTRAVENOUS | Status: DC | PRN
  Administered 2023-04-21: 10 mL via INTRAVENOUS

## 2023-04-21 MED ORDER — ARMC OPHTHALMIC DILATING DROPS
1.0000 | OPHTHALMIC | Status: DC | PRN
  Administered 2023-04-21 (×3): 1 via OPHTHALMIC

## 2023-04-21 MED ORDER — SIGHTPATH DOSE#1 NA HYALUR & NA CHOND-NA HYALUR IO KIT
PACK | INTRAOCULAR | Status: DC | PRN
  Administered 2023-04-21: 1 via OPHTHALMIC

## 2023-04-21 MED ORDER — FENTANYL CITRATE (PF) 100 MCG/2ML IJ SOLN
INTRAMUSCULAR | Status: DC | PRN
  Administered 2023-04-21: 50 ug via INTRAVENOUS

## 2023-04-21 MED ORDER — PHENYLEPHRINE-KETOROLAC 1-0.3 % IO SOLN
INTRAOCULAR | Status: DC | PRN
  Administered 2023-04-21: 93 mL via OPHTHALMIC

## 2023-04-21 MED ORDER — LACTATED RINGERS IV SOLN: INTRAVENOUS | Status: DC

## 2023-04-21 MED ORDER — MOXIFLOXACIN HCL 0.5 % OP SOLN
OPHTHALMIC | Status: DC | PRN
  Administered 2023-04-21: .2 mL via OPHTHALMIC

## 2023-04-21 SURGICAL SUPPLY — 9 items
CATARACT SUITE SIGHTPATH (MISCELLANEOUS) ×1
DISSECTOR HYDRO NUCLEUS 50X22 (MISCELLANEOUS) ×1 IMPLANT
DRSG TEGADERM 2-3/8X2-3/4 SM (GAUZE/BANDAGES/DRESSINGS) ×1 IMPLANT
FEE CATARACT SUITE SIGHTPATH (MISCELLANEOUS) ×1 IMPLANT
GLOVE SURG SYN 7.5 E (GLOVE) ×1
GLOVE SURG SYN 7.5 PF PI (GLOVE) ×1 IMPLANT
GLOVE SURG SYN 8.5 E (GLOVE) ×1
GLOVE SURG SYN 8.5 PF PI (GLOVE) ×1 IMPLANT
LENS IOL TECNIS EYHANCE 23.0 (Intraocular Lens) IMPLANT

## 2023-04-21 NOTE — H&P (Signed)
[facility]   Primary Care Physician:  Oswaldo Conroy, MD Ophthalmologist: Dr. Deberah Pelton  Pre-Procedure History & Physical: HPI:  Dean Maldonado is a 77 y.o. male here for cataract surgery.   Past Medical History:  Diagnosis Date   GERD (gastroesophageal reflux disease)    Hypertension    Interstitial lung disease (HCC)    TIA (transient ischemic attack)    Vertigo     History reviewed. No pertinent surgical history.  Prior to Admission medications   Medication Sig Start Date End Date Taking? Authorizing Provider  aspirin 81 MG EC tablet Take by mouth.   Yes [provider]  atorvastatin (LIPITOR) 20 MG tablet Take 20 mg by mouth daily. 11/27/19  Yes [provider]  lisinopril-hydrochlorothiazide (ZESTORETIC) 20-12.5 MG tablet Take 1 tablet by mouth daily. 09/10/19  Yes [provider]  pantoprazole (PROTONIX) 40 MG tablet Take 40 mg by mouth daily. 01/01/20  Yes [provider]  sitaGLIPtin-metformin (JANUMET) 50-1000 MG tablet Take 1 tablet by mouth 2 (two) times daily with a meal. 03/05/20  Yes Arnetha Courser, MD  tamsulosin (FLOMAX) 0.4 MG CAPS capsule Take 0.4 mg by mouth daily. 01/01/20  Yes [provider]    Allergies as of 12/28/2022   (No Known Allergies)    History reviewed. No pertinent family history.  Social History   Socioeconomic History   Marital status: Married    Spouse name: Not on file   Number of children: Not on file   Years of education: Not on file   Highest education level: Not on file  Occupational History   Not on file  Tobacco Use   Smoking status: Never   Smokeless tobacco: Never  Substance and Sexual Activity   Alcohol use: Not on file   Drug use: Not on file   Sexual activity: Not on file  Other Topics Concern   Not on file  Social History Narrative   Not on file   Social Determinants of Health   Financial Resource Strain: Not on file  Food Insecurity: Not on file   Transportation Needs: Not on file  Physical Activity: Not on file  Stress: Not on file  Social Connections: Not on file  Intimate Partner Violence: Not on file    Review of Systems: See HPI, otherwise negative ROS  Physical Exam: Ht 5\' 6"  (1.676 m)   Wt 95.3 kg   BMI 33.89 kg/m  General:   Alert, cooperative in NAD Head:  Normocephalic and atraumatic. Respiratory:  Normal work of breathing. Cardiovascular:  RRR  Impression/Plan: Dean Maldonado is here for cataract surgery.  Risks, benefits, limitations, and alternatives regarding cataract surgery have been reviewed with the patient.  Questions have been answered.  All parties agreeable.   [name], MD  04/21/2023, 7:17 AM

## 2023-04-21 NOTE — Anesthesia Postprocedure Evaluation (Signed)
Anesthesia Post Note  Patient: Dean Maldonado  Procedure(s) Performed: CATARACT EXTRACTION PHACO AND INTRAOCULAR LENS PLACEMENT (IOC) LEFT OMIDRIA  8.57  01:05.3 (Left: Eye)  Patient location during evaluation: PACU Anesthesia Type: MAC Level of consciousness: awake and alert Pain management: pain level controlled Vital Signs Assessment: post-procedure vital signs reviewed and stable Respiratory status: spontaneous breathing, nonlabored ventilation, respiratory function stable and patient connected to nasal cannula oxygen Cardiovascular status: stable and blood pressure returned to baseline Postop Assessment: no apparent nausea or vomiting Anesthetic complications: no   No notable events documented.   Last Vitals:  Vitals:   04/21/23 0924 04/21/23 0928  BP:  (!) 128/95  Pulse: 93 93  Resp: 19 15  Temp: 36.7 C   SpO2: 97% 96%    Last Pain:  Vitals:   04/21/23 0928  TempSrc:   PainSc: 0-No pain                 Corinda Gubler

## 2023-04-21 NOTE — Transfer of Care (Signed)
Immediate Anesthesia Transfer of Care Note  Patient: Dean Maldonado  Procedure(s) Performed: CATARACT EXTRACTION PHACO AND INTRAOCULAR LENS PLACEMENT (IOC) LEFT OMIDRIA  8.57  01:05.3 (Left: Eye)  Patient Location: PACU  Anesthesia Type:MAC  Level of Consciousness: awake, alert , and oriented  Airway & Oxygen Therapy: Patient Spontanous Breathing  Post-op Assessment: Report given to RN and Post -op Vital signs reviewed and stable  Post vital signs: Reviewed and stable  Last Vitals: See flow sheet for nl v/s in PACU  Vitals Value Taken Time  BP 133/77 04/21/23 0922  Temp    Pulse 93 04/21/23 0923  Resp 19 04/21/23 0923  SpO2 97 % 04/21/23 0923  Vitals shown include unfiled device data.  Last Pain:  Vitals:   04/21/23 0728  TempSrc: Temporal  PainSc: 0-No pain         Complications: No notable events documented.

## 2023-04-21 NOTE — Op Note (Signed)
OPERATIVE NOTE  Dean Maldonado [id number] 04/21/2023   PREOPERATIVE DIAGNOSIS: Nuclear sclerotic cataract left eye. H25.12   POSTOPERATIVE DIAGNOSIS: Nuclear sclerotic cataract left eye. H25.12   PROCEDURE:  Phacoemusification with posterior chamber intraocular lens placement of the left eye  Ultrasound time: Procedure(s): CATARACT EXTRACTION PHACO AND INTRAOCULAR LENS PLACEMENT (IOC) LEFT OMIDRIA  8.57  01:05.3 (Left)  LENS:   Implant Name Type Inv. Item Serial No. Manufacturer Lot No. LRB No. Used Action  LENS IOL TECNIS EYHANCE 23.0 - [id number] Intraocular Lens LENS IOL TECNIS EYHANCE 23.0 [id number] [employer]  Left 1 Implanted      SURGEON:  Julious Payer. Rolley Sims, MD   ANESTHESIA:  Topical with tetracaine drops, augmented with 1% preservative-free intracameral lidocaine.   COMPLICATIONS:  None.   DESCRIPTION OF PROCEDURE:  The patient was identified in the holding room and transported to the operating room and placed in the supine position under the operating microscope.  The left eye was identified as the operative eye, which was prepped and draped in the usual sterile ophthalmic fashion.   A 1 millimeter clear-corneal paracentesis was made inferotemporally. Preservative-free 1% lidocaine mixed with 1:1,000 bisulfite-free aqueous solution of epinephrine was injected into the anterior chamber. The anterior chamber was then filled with Viscoat viscoelastic. A 2.4 millimeter keratome was used to make a clear-corneal incision superotemporally. A curvilinear capsulorrhexis was made with a cystotome and capsulorrhexis forceps. Balanced salt solution was used to hydrodissect and hydrodelineate the nucleus. Phacoemulsification was then used to remove the lens nucleus and epinucleus. The remaining cortex was then removed using the irrigation and aspiration handpiece. Provisc was then placed into the capsular bag to distend it for lens placement. A +23.00 D DIB00 intraocular lens was then  injected into the capsular bag. The remaining viscoelastic was aspirated.   Wounds were hydrated with balanced salt solution.  The anterior chamber was inflated to a physiologic pressure with balanced salt solution.  No wound leaks were noted. Vigamox was injected intracamerally.  Timolol and Brimonidine drops were applied to the eye.  The patient was taken to the recovery room in stable condition without complications of anesthesia or surgery.  [employer] 04/21/2023, 9:20 AM

## 2023-04-23 ENCOUNTER — Encounter: Payer: Self-pay | Admitting: Ophthalmology

## 2023-05-14 IMAGING — US US EXTREM LOW VENOUS*R*
1 series · 13 of 24 positions shown · non-contrast
Comparison: None.

CLINICAL DATA: Right lower extremity swelling.



[Series 1: us venous img lower uni right (dvt) · portal-venous · 13 of 34 slices shown]
[im 1/34]
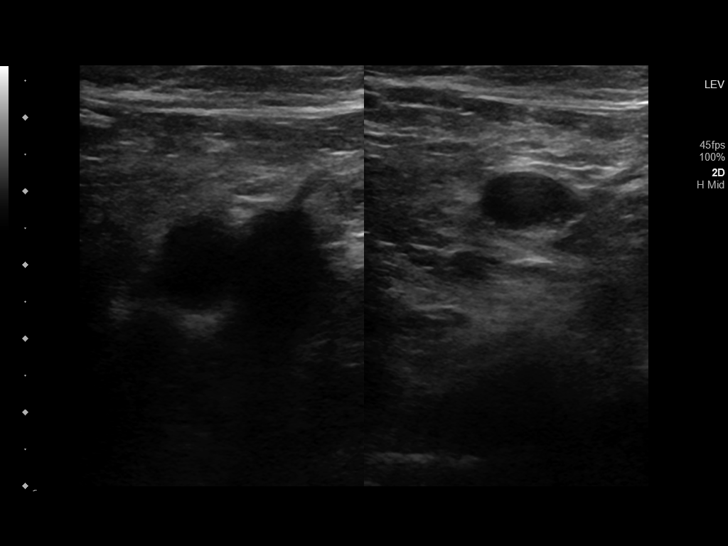
[im 3/34]
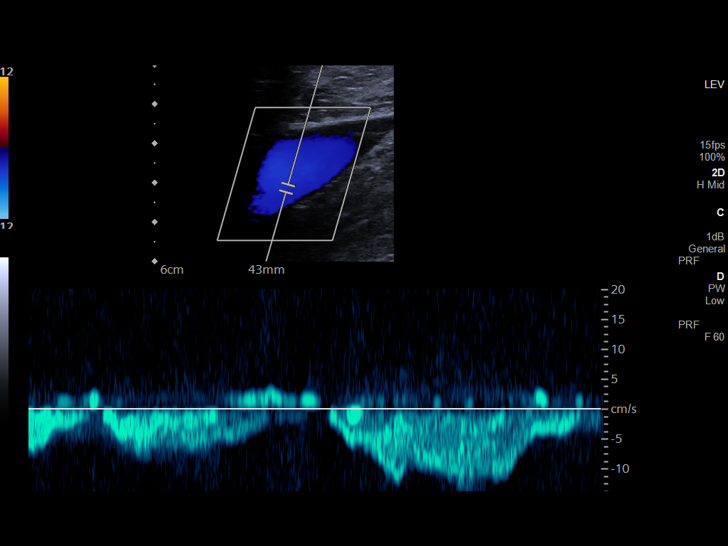
[im 6/34]
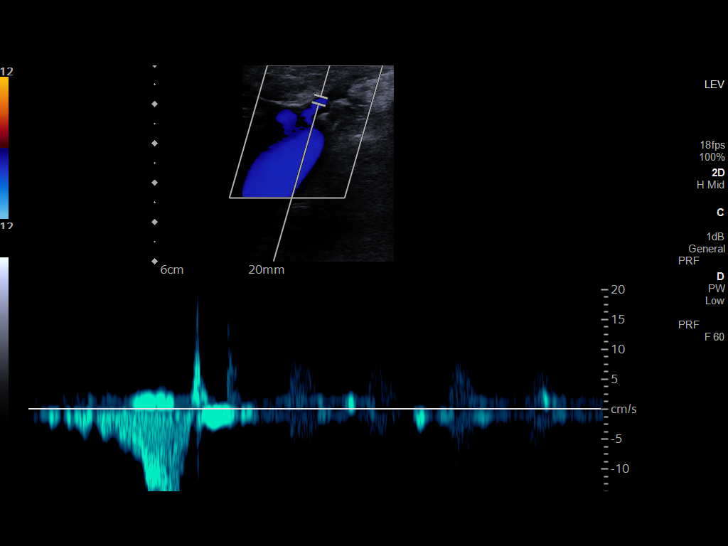
[im 9/34]
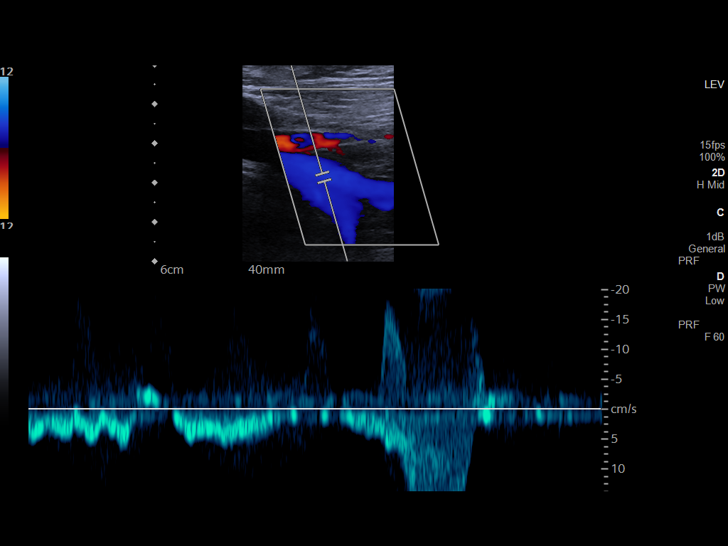
[im 12/34]
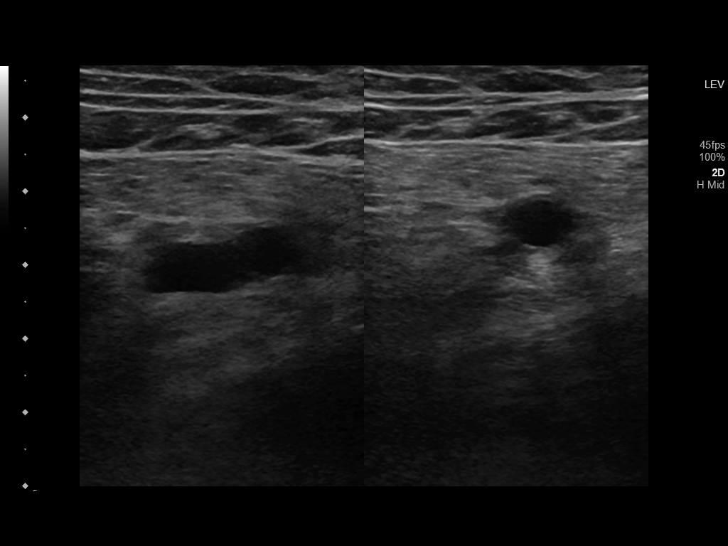
[im 15/34]
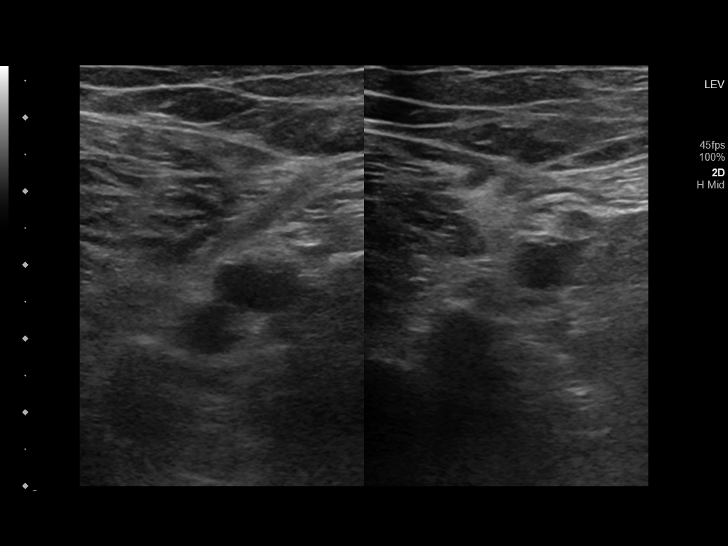
[im 18/34]
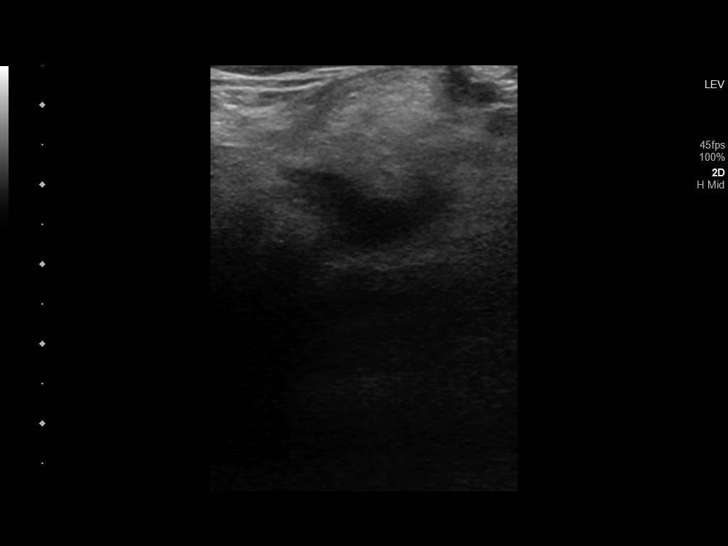
[im 19/34]
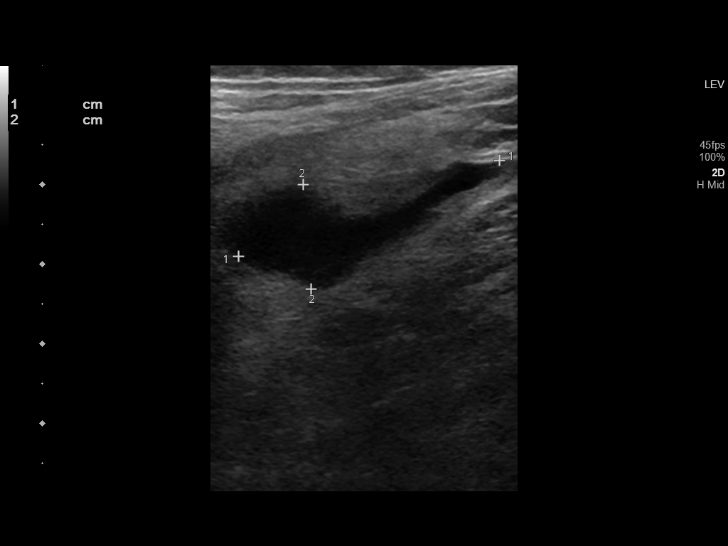
[im 22/34]
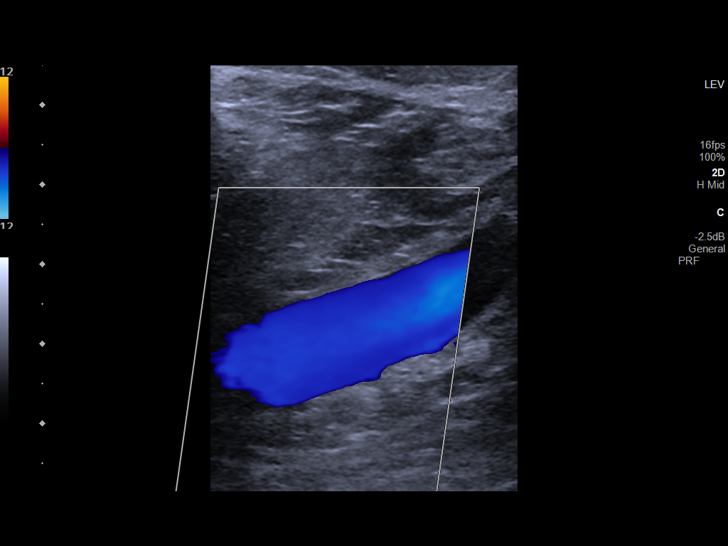
[im 25/34]
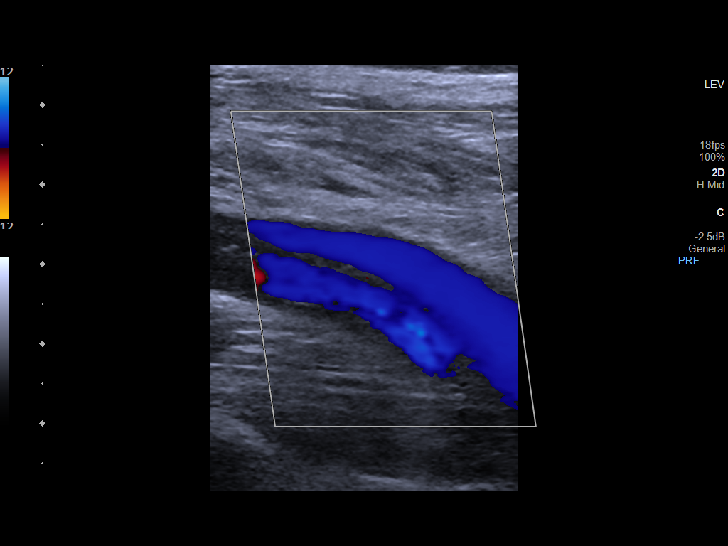
[im 28/34]
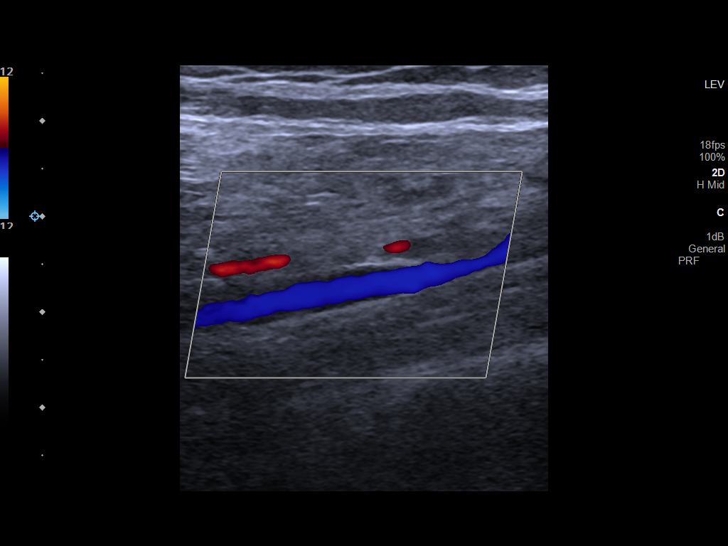
[im 31/34]
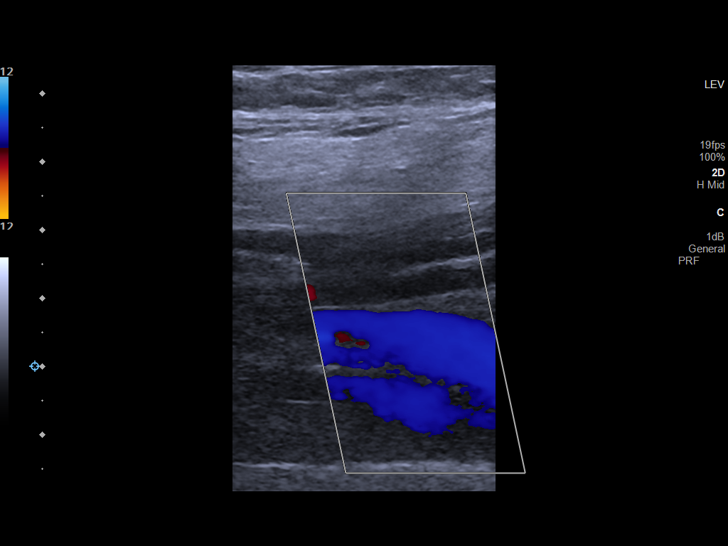
[im 34/34]
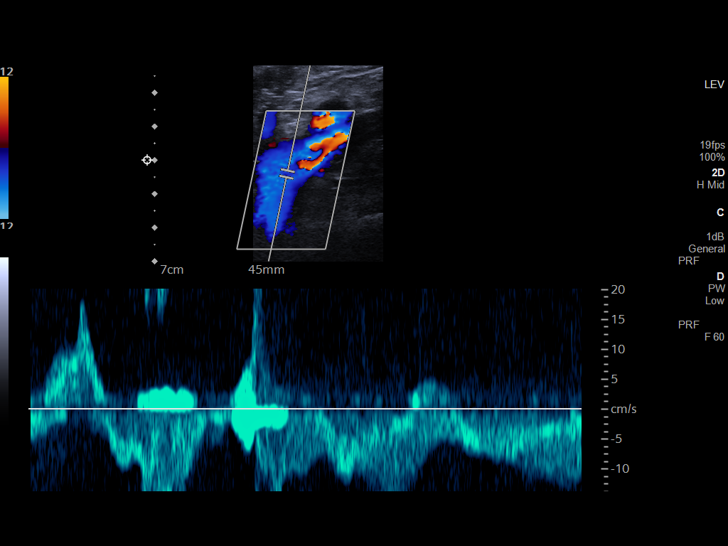

[13 of 24 positions shown; findings below may reference images not displayed]

FINDINGS: Contralateral Common Femoral Vein: Respiratory phasicity is normal
and symmetric with the symptomatic side. No evidence of thrombus.
Normal compressibility.

Common Femoral Vein: No evidence of thrombus. Normal
compressibility, respiratory phasicity and response to augmentation.

Saphenofemoral Junction: No evidence of thrombus. Normal
compressibility and flow on color Doppler imaging.

Profunda Femoral Vein: No evidence of thrombus. Normal
compressibility and flow on color Doppler imaging.

Femoral Vein: No evidence of thrombus. Normal compressibility,
respiratory phasicity and response to augmentation.

Popliteal Vein: No evidence of thrombus. Normal compressibility,
respiratory phasicity and response to augmentation.

Calf Veins: No evidence of thrombus. Normal compressibility and flow
on color Doppler imaging.

Other Findings: Fluid collection in the popliteal fossa measures
x 1.3 x 2.3 cm.
IMPRESSION: 1.  Negative for deep venous thrombosis in right lower extremity.
2. Right knee Baker's cyst measuring up to 3.5 cm.
